# Patient Record
Sex: Female | Born: 2000 | Race: White | Hispanic: No | Marital: Single | Smoking: Never smoker
Health system: Southern US, Community
[De-identification: ages and names within clinical notes are randomized; demographics above are authoritative.]

## PROBLEM LIST (undated history)

## (undated) DIAGNOSIS — T7840XA Allergy, unspecified, initial encounter: Secondary | ICD-10-CM

## (undated) DIAGNOSIS — J45909 Unspecified asthma, uncomplicated: Secondary | ICD-10-CM

## (undated) DIAGNOSIS — K589 Irritable bowel syndrome without diarrhea: Secondary | ICD-10-CM

## (undated) DIAGNOSIS — K200 Eosinophilic esophagitis: Secondary | ICD-10-CM

## (undated) DIAGNOSIS — F909 Attention-deficit hyperactivity disorder, unspecified type: Secondary | ICD-10-CM

## (undated) DIAGNOSIS — K219 Gastro-esophageal reflux disease without esophagitis: Secondary | ICD-10-CM

## (undated) DIAGNOSIS — H539 Unspecified visual disturbance: Secondary | ICD-10-CM

## (undated) DIAGNOSIS — F419 Anxiety disorder, unspecified: Secondary | ICD-10-CM

## (undated) DIAGNOSIS — H6690 Otitis media, unspecified, unspecified ear: Secondary | ICD-10-CM

## (undated) DIAGNOSIS — K5900 Constipation, unspecified: Secondary | ICD-10-CM

## (undated) HISTORY — DX: Otitis media, unspecified, unspecified ear: H66.90

## (undated) HISTORY — PX: ESOPHAGOGASTRODUODENOSCOPY: SHX1529

## (undated) HISTORY — DX: Gastro-esophageal reflux disease without esophagitis: K21.9

## (undated) HISTORY — DX: Attention-deficit hyperactivity disorder, unspecified type: F90.9

## (undated) HISTORY — PX: TYMPANOPLASTY: SHX33

## (undated) HISTORY — DX: Unspecified visual disturbance: H53.9

## (undated) HISTORY — DX: Eosinophilic esophagitis: K20.0

## (undated) HISTORY — PX: TYMPANOSTOMY TUBE PLACEMENT: SHX32

## (undated) HISTORY — DX: Allergy, unspecified, initial encounter: T78.40XA

## (undated) HISTORY — DX: Unspecified asthma, uncomplicated: J45.909

## (undated) HISTORY — DX: Anxiety disorder, unspecified: F41.9

## (undated) HISTORY — DX: Constipation, unspecified: K59.00

## (undated) HISTORY — DX: Irritable bowel syndrome, unspecified: K58.9

---
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
## 2021-01-04 ENCOUNTER — Other Ambulatory Visit: Payer: Self-pay

## 2021-01-04 MED ORDER — DEXMETHYLPHENIDATE HCL ER 40 MG PO CP24
40.0000 mg | ORAL_CAPSULE | ORAL | 0 refills | Status: DC
Start: 2021-01-04 — End: 2021-02-05

## 2021-01-04 NOTE — Telephone Encounter (Signed)
RX for above e-scribed and sent to pharmacy on record  [id number] [place], [place] - [address] [facility] [address] [place] [address] Phone: [phone] Fax: [phone]

## 2021-01-04 NOTE — Telephone Encounter (Signed)
Last visit 10/19/2021 next visit 04/12/2021

## 2021-01-12 ENCOUNTER — Other Ambulatory Visit: Payer: Self-pay | Admitting: Family

## 2021-01-12 DIAGNOSIS — F411 Generalized anxiety disorder: Secondary | ICD-10-CM

## 2021-01-12 NOTE — Telephone Encounter (Signed)
Buspar 15 mg 3 tablets BID, # 540 for 3 month supply, 4 RF's.RX for above e-scribed and sent to pharmacy on record  [id number] - [place], [place] - [address] AT CORNER OF [facility] [address] [place] [address] Phone: [phone] Fax: [phone]

## 2021-01-18 DIAGNOSIS — F902 Attention-deficit hyperactivity disorder, combined type: Secondary | ICD-10-CM | POA: Diagnosis not present

## 2021-01-31 DIAGNOSIS — M545 Low back pain, unspecified: Secondary | ICD-10-CM | POA: Diagnosis not present

## 2021-02-01 DIAGNOSIS — F902 Attention-deficit hyperactivity disorder, combined type: Secondary | ICD-10-CM | POA: Diagnosis not present

## 2021-02-05 ENCOUNTER — Other Ambulatory Visit: Payer: Self-pay

## 2021-02-05 MED ORDER — DEXMETHYLPHENIDATE HCL 10 MG PO TABS: ORAL_TABLET | ORAL | 0 refills | Status: DC

## 2021-02-05 MED ORDER — DEXMETHYLPHENIDATE HCL ER 40 MG PO CP24: 40.0000 mg | ORAL_CAPSULE | ORAL | 0 refills | Status: DC

## 2021-02-05 NOTE — Telephone Encounter (Signed)
  RX for above e-scribed and sent to pharmacy on record  [facility] - [place], [place] - [address] [place] [address] Phone: [phone] Fax: [phone]

## 2021-02-05 NOTE — Telephone Encounter (Signed)
Last visit 10/19/2021 next visit 04/12/2021 

## 2021-02-08 DIAGNOSIS — F902 Attention-deficit hyperactivity disorder, combined type: Secondary | ICD-10-CM | POA: Diagnosis not present

## 2021-02-15 DIAGNOSIS — F902 Attention-deficit hyperactivity disorder, combined type: Secondary | ICD-10-CM | POA: Diagnosis not present

## 2021-02-20 DIAGNOSIS — F902 Attention-deficit hyperactivity disorder, combined type: Secondary | ICD-10-CM | POA: Diagnosis not present

## 2021-03-01 DIAGNOSIS — D485 Neoplasm of uncertain behavior of skin: Secondary | ICD-10-CM | POA: Diagnosis not present

## 2021-03-01 DIAGNOSIS — F902 Attention-deficit hyperactivity disorder, combined type: Secondary | ICD-10-CM | POA: Diagnosis not present

## 2021-03-01 DIAGNOSIS — D224 Melanocytic nevi of scalp and neck: Secondary | ICD-10-CM | POA: Diagnosis not present

## 2021-03-05 ENCOUNTER — Other Ambulatory Visit: Payer: Self-pay

## 2021-03-05 MED ORDER — DEXMETHYLPHENIDATE HCL ER 40 MG PO CP24: 40.0000 mg | ORAL_CAPSULE | ORAL | 0 refills | Status: DC

## 2021-03-05 NOTE — Telephone Encounter (Signed)
RX for above e-scribed and sent to pharmacy on record  [facility] - [place], [place] - [address] [place] [address] Phone: [phone] Fax: [phone]

## 2021-03-05 NOTE — Telephone Encounter (Signed)
Last visit 10/19/2021 next visit 04/12/2021

## 2021-03-15 DIAGNOSIS — F902 Attention-deficit hyperactivity disorder, combined type: Secondary | ICD-10-CM | POA: Diagnosis not present

## 2021-03-16 DIAGNOSIS — M545 Low back pain, unspecified: Secondary | ICD-10-CM | POA: Diagnosis not present

## 2021-03-26 DIAGNOSIS — M545 Low back pain, unspecified: Secondary | ICD-10-CM | POA: Diagnosis not present

## 2021-03-28 DIAGNOSIS — F902 Attention-deficit hyperactivity disorder, combined type: Secondary | ICD-10-CM | POA: Diagnosis not present

## 2021-03-29 DIAGNOSIS — M545 Low back pain, unspecified: Secondary | ICD-10-CM | POA: Diagnosis not present

## 2021-04-03 ENCOUNTER — Other Ambulatory Visit: Payer: Self-pay

## 2021-04-03 DIAGNOSIS — M545 Low back pain, unspecified: Secondary | ICD-10-CM | POA: Diagnosis not present

## 2021-04-03 MED ORDER — DEXMETHYLPHENIDATE HCL ER 40 MG PO CP24: 40.0000 mg | ORAL_CAPSULE | ORAL | 0 refills | Status: DC

## 2021-04-03 NOTE — Telephone Encounter (Signed)
Focalin XR 40 mg daily, # 30 with no RF's.RX for above e-scribed and sent to pharmacy [facility] record [facility] [place] [place]

## 2021-04-05 DIAGNOSIS — M545 Low back pain, unspecified: Secondary | ICD-10-CM | POA: Diagnosis not present

## 2021-04-06 MED ORDER — DEXMETHYLPHENIDATE HCL ER 40 MG PO CP24: 40.0000 mg | ORAL_CAPSULE | ORAL | 0 refills | Status: DC

## 2021-04-06 NOTE — Addendum Note (Signed)
Addended by: Carron Curie on: 04/06/2021 08:22 AM   Modules accepted: Orders

## 2021-04-06 NOTE — Telephone Encounter (Signed)
[facility]  in stock mom would like to sent to [facility] on [address]

## 2021-04-06 NOTE — Telephone Encounter (Signed)
Resent Rx to different pharmacy related to Focalin XR 40 mg on back order.RX for above e-scribed and sent to pharmacy on record   [id number] [place], [place] - [address] AT [facility] [address] [facility] [address] [facility] [address] [place] [address] Phone: [phone] Fax: [phone]

## 2021-04-06 NOTE — Addendum Note (Signed)
Addended by: Burgess Estelle on: 04/06/2021 08:15 AM   Modules accepted: Orders

## 2021-04-09 DIAGNOSIS — M545 Low back pain, unspecified: Secondary | ICD-10-CM | POA: Diagnosis not present

## 2021-04-12 ENCOUNTER — Encounter: Payer: Self-pay | Admitting: Family

## 2021-04-12 ENCOUNTER — Other Ambulatory Visit: Payer: Self-pay

## 2021-04-12 ENCOUNTER — Telehealth (INDEPENDENT_AMBULATORY_CARE_PROVIDER_SITE_OTHER): Payer: BC Managed Care – PPO | Admitting: Family

## 2021-04-12 DIAGNOSIS — R278 Other lack of coordination: Secondary | ICD-10-CM | POA: Diagnosis not present

## 2021-04-12 DIAGNOSIS — F411 Generalized anxiety disorder: Secondary | ICD-10-CM

## 2021-04-12 DIAGNOSIS — F902 Attention-deficit hyperactivity disorder, combined type: Secondary | ICD-10-CM | POA: Diagnosis not present

## 2021-04-12 DIAGNOSIS — Z7189 Other specified counseling: Secondary | ICD-10-CM

## 2021-04-12 DIAGNOSIS — F840 Autistic disorder: Secondary | ICD-10-CM

## 2021-04-12 DIAGNOSIS — Z719 Counseling, unspecified: Secondary | ICD-10-CM

## 2021-04-12 DIAGNOSIS — Z79899 Other long term (current) drug therapy: Secondary | ICD-10-CM

## 2021-04-12 NOTE — Progress Notes (Signed)
[facility] [address], [place] [place] [address] Dept: [phone] Dept Fax: [phone]  Medication Check visit via Virtual Video   Patient ID:  Tammy Maldonado  female DOB: 08-28-2001   20 y.o.   MRN: [id number]   DATE:04/12/21  PCP: Joaquin Courts, NP  Virtual Visit via Video Note  I connected with  Tammy Maldonado on 04/13/21 at  9:00 AM EDT by a video enabled telemedicine application and verified that I am speaking with the correct person using two identifiers. Patient/Parent Location: at home   I discussed the limitations, risks, security and privacy concerns of performing an evaluation and management service by telephone and the availability of in person appointments. I also discussed with the parents that there may be a patient responsible charge related to this service. The parents expressed understanding and agreed to proceed.  Provider: Carron Curie, NP  Location: work location  HPI/CURRENT STATUS: Tammy Maldonado is here for medication management of the psychoactive medications for ADHD and review of educational and behavioral concerns.   Tammy Maldonado currently taking Focalin XR 40 mg in the morning daily,  which is working well. Takes medication with breakfast. Medication tends to wear off around 2-3:00 pm and will take her immediate release Focalin 10 mg 1-2 tablets as needed. Tammy Maldonado is able to focus through school/homework.   Tammy Maldonado is eating well (eating breakfast, lunch and dinner). Eating well with no changes.   Sleeping well (getting about 8 hours most nights), sleeping through the night. Schedule requires more attention and attempting to get enough sleep each night.   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   Screen time: (phone, tablet, TV, computer): Minimal hours for the summer time.   MEDICAL HISTORY: Individual Medical History/ Review of Systems: Lower back muscle strain and now attending PT. Injury occurred in January with f/u at school to get assistance.   Family Medical/ Social History: Changes? None reported recently Patient Lives with: parents   MENTAL HEALTH: Mental Health Issues:   Anxiety-well controlled with Buspar   Allergies: Allergies  Allergen Reactions   Celexa [Citalopram] Rash    Current Medications:  Current Outpatient Medications  Medication Instructions   busPIRone (BUSPAR) 15 MG tablet TAKE 3 TABLETS BY MOUTH TWICE A DAY   cetirizine (ZYRTEC) 10 mg, Oral, Daily   dexmethylphenidate (FOCALIN) 10 MG tablet Take 1 tablet at noon and 2 tablets at 3-5 PM for homework   Dexmethylphenidate HCl (FOCALIN XR) 40 mg, Oral, BH-each morning   EPIDUO FORTE 0.3-2.5 % GEL Topical, Daily, as directed   fluticasone (FLONASE) 50 MCG/ACT nasal spray 1 spray, 2 times daily   minocycline (MINOCIN) 100 MG capsule TAKE 1 CAPSULE BY MOUTH ONCE A DAY WITH FOOD AND DO NOT TAKE ON AN EMPTY STOMACH   Medication Side Effects: None  DIAGNOSES:    ICD-10-CM   1. ADHD (attention deficit hyperactivity disorder), combined type  F90.2     2. Autism  F84.0     3. Dysgraphia  R27.8     4. Generalized anxiety disorder  F41.1     5. Medication management  Z79.899     6. Patient counseled  Z71.9     7. Goals of care, counseling/discussion  Z71.89      ASSESSMENT: Patient doing  well at college with above average grades last semester and has disability services in place. No academic issues reported by patient and adjusted to being away for college with no concerns. Has continued with her current medication regimen with no issues and reported efficacy for her ADHD and Anxiety symptoms. Currently attending PT for back injury related to muscle  strain during swim season and attending swim workouts routinely. No changes in eating habits. Needing to get more sleep on a regular basis. Tammy Maldonado has reported efficacy with her medication regimen with no current concerns. To continued with her current medication with no changes needed today. F/u in 3 months.   PLAN/RECOMMENDATIONS:  Patient provided updates with school success her 1st year in college at [employer] in [place]. Academic performance was above average with no issues or concerns.  Tammy Maldonado has continued with support services in the college setting with positive results. No difficulties with academics reported.   Currently getting PT for back injury due to muscle strain during swim season. Tammy Maldonado has continued with regular swim practice and work outs regularly.   Reviewed organization and time management with success this past year at school along with sports and social interactions.    Supported patient with appropriate sleep hygiene of 8 hours each night. Not getting enough sleep and some days feeling more tired. Discussed sleep hygiene at length with work and swim practice along with PT.   Counseled medication pharmacokinetics, options, dosage, administration, desired effects, and possible side effects.   Focalin XR 40 mg daily, no Rx today Focalin 10 mg 2-3 tablets in the pm prn, no Rx today Buspar 15 mg daily 3 tablets BID, no Rx today   I discussed the assessment and treatment plan with the patient. The patient was provided an opportunity to ask questions and all were answered. The patient agreed with the plan and demonstrated an understanding of the instructions.   I provided 25 minutes of non-face-to-face time during this encounter. Completed record review for 10 minutes prior to the virtual video visit.   NEXT APPOINTMENT:  August for in person visit before returning to college in [place].   Return in about 3 months (around 07/13/2021) for f/u visit.  The patient was advised to call back  or seek an in-person evaluation if the symptoms worsen or if the condition fails to improve as anticipated.   Carron Curie, NP

## 2021-04-13 ENCOUNTER — Encounter: Payer: Self-pay | Admitting: Family

## 2021-04-16 DIAGNOSIS — M545 Low back pain, unspecified: Secondary | ICD-10-CM | POA: Diagnosis not present

## 2021-04-19 DIAGNOSIS — M545 Low back pain, unspecified: Secondary | ICD-10-CM | POA: Diagnosis not present

## 2021-04-26 DIAGNOSIS — F902 Attention-deficit hyperactivity disorder, combined type: Secondary | ICD-10-CM | POA: Diagnosis not present

## 2021-05-07 ENCOUNTER — Telehealth: Payer: Self-pay | Admitting: Pediatrics

## 2021-05-07 DIAGNOSIS — M545 Low back pain, unspecified: Secondary | ICD-10-CM | POA: Diagnosis not present

## 2021-05-07 MED ORDER — DEXMETHYLPHENIDATE HCL ER 40 MG PO CP24: 40.0000 mg | ORAL_CAPSULE | ORAL | 0 refills | Status: DC

## 2021-05-07 NOTE — Telephone Encounter (Signed)
Mom called to request refill Focalin XR 40 E-Prescribed directly to  [id number] [place], [place] - [address] AT CORNER OF [facility] [address] [place] [address] Phone: [phone] Fax: [phone]

## 2021-05-10 DIAGNOSIS — M545 Low back pain, unspecified: Secondary | ICD-10-CM | POA: Diagnosis not present

## 2021-05-10 DIAGNOSIS — F902 Attention-deficit hyperactivity disorder, combined type: Secondary | ICD-10-CM | POA: Diagnosis not present

## 2021-05-22 DIAGNOSIS — M791 Myalgia, unspecified site: Secondary | ICD-10-CM | POA: Diagnosis not present

## 2021-05-24 DIAGNOSIS — M791 Myalgia, unspecified site: Secondary | ICD-10-CM | POA: Diagnosis not present

## 2021-05-24 DIAGNOSIS — F902 Attention-deficit hyperactivity disorder, combined type: Secondary | ICD-10-CM | POA: Diagnosis not present

## 2021-05-29 DIAGNOSIS — M791 Myalgia, unspecified site: Secondary | ICD-10-CM | POA: Diagnosis not present

## 2021-05-30 ENCOUNTER — Other Ambulatory Visit: Payer: Self-pay

## 2021-05-30 ENCOUNTER — Encounter: Payer: Self-pay | Admitting: Family

## 2021-05-30 ENCOUNTER — Ambulatory Visit: Payer: BC Managed Care – PPO | Admitting: Family

## 2021-05-30 VITALS — BP 112/72 | HR 76 | Resp 16 | Ht 69.29 in | Wt 175.4 lb

## 2021-05-30 DIAGNOSIS — F840 Autistic disorder: Secondary | ICD-10-CM

## 2021-05-30 DIAGNOSIS — F902 Attention-deficit hyperactivity disorder, combined type: Secondary | ICD-10-CM

## 2021-05-30 DIAGNOSIS — Z79899 Other long term (current) drug therapy: Secondary | ICD-10-CM

## 2021-05-30 DIAGNOSIS — F411 Generalized anxiety disorder: Secondary | ICD-10-CM | POA: Diagnosis not present

## 2021-05-30 DIAGNOSIS — R278 Other lack of coordination: Secondary | ICD-10-CM | POA: Diagnosis not present

## 2021-05-30 DIAGNOSIS — Z719 Counseling, unspecified: Secondary | ICD-10-CM

## 2021-05-30 DIAGNOSIS — Z7189 Other specified counseling: Secondary | ICD-10-CM

## 2021-05-30 MED ORDER — DEXMETHYLPHENIDATE HCL 10 MG PO TABS: ORAL_TABLET | ORAL | 0 refills | Status: DC

## 2021-05-30 MED ORDER — DEXMETHYLPHENIDATE HCL ER 40 MG PO CP24: 40.0000 mg | ORAL_CAPSULE | ORAL | 0 refills | Status: DC

## 2021-05-30 NOTE — Progress Notes (Signed)
[facility] [address] [place] [address] Dept: [phone] Dept Fax: [phone] Loc: [phone] Loc Fax: [phone]  Medication Check  Patient ID: Tammy Maldonado, female  DOB: [birth year], 20 y.o.  MRN: [id number]  Date of Evaluation: 05/30/2021 PCP: Joaquin Courts, NP  Accompanied by: self Patient Lives with: parents  HISTORY/CURRENT STATUS: HPI Patient here by herself for the visit today. Patient interactive and appropriate with provider. Patient did well academically last semester with disability services in place. Working out this summer and attending PT for her back. Has continued with medication regimen with no concerns or side effects.   EDUCATION: School: [employer] [place] Year/Grade:  Sophomore year   Performance/ Grades: above average Services: Other: Disability Services Activities/ Exercise: participates in swimming, working out still PT 2 times/week Campus living with 3 roommates  MEDICAL HISTORY: Appetite: Eating well with no changes  MVI/Other: Daily MVI    Sleep: No issues reported Concerns: Initiation/Maintenance/Other: None  Individual Medical History/ Review of Systems: Changes? :Yes PT related to back has continued 2 days/week. No recent PCP visit.   Allergies: Celexa [citalopram]  Current Medications: Medication Side Effects: None  Family Medical/ Social History: Changes? None   MENTAL HEALTH: Mental Health Issues: Anxiety-Buspar controlling symptoms  PHYSICAL EXAM; Vitals:  Vitals:   05/30/21 1307  BP: 112/72  Pulse: 76  Resp: 16  Weight: 175 lb 6.4 oz (79.6 kg)  Height: 5' 9.29" (1.76 m)    General Physical Exam: Unchanged from previous exam, date:04/12/2021 Changed:None  DIAGNOSES:    ICD-10-CM   1. ADHD (attention deficit hyperactivity disorder), combined type  F90.2     2. Autism  F84.0      3. Dysgraphia  R27.8     4. Generalized anxiety disorder  F41.1     5. Medication management  Z79.899     6. Patient counseled  Z71.9     7. Goals of care, counseling/discussion  Z71.89      ASSESSMENT: Tammy Maldonado is a 20 year old female with a history of ADHD, ASD and Anxiety with good symptom control on her current medication regimen. Academically performed above average for her spring semester at college with disability services in place. Participating in activity this summer with swimming and workouts along with PT for back injury. Eating a variety of foods daily with no concerns. Getting enough sleep each night. Will continue with current mediations with no changes in dosing or regimen. F/u in 3 months   RECOMMENDATIONS:  Patient provided updates with school and academic success last year. To return to [employer] in [place] next year.   Has continued with disability services at school and using for testing with extended time needed. Academic support has provided needed success this past school year.   Still getting PT on a regular basis for her back injury. Working out and swimming on a regular basis.  Eating with no current concerns and getting plenty of calories daily with activity. Supported protein with daily intake for foods and snacks.   Organization and time management discussed with school for academic success along with sports and social settings.   Sleep hygiene and bedtime routine discussed with plenty of sleep each night. Encouraged regular schedule with sleep/wake cycle at school.   Counseled medication pharmacokinetics, options, dosage, administration, desired effects, and possible side effects.   Focalin XR 40 mg daily,# 30 with no RF's Focalin 10 mg  2-3 tablets in the pm prn,# 90 with no RF"s Buspar 15 mg daily 3 tablets BID, no Rx today RX for above e-scribed and sent to pharmacy on record  [id number] [place], [place] - [address] AT [address] OF [facility]  [address] [place] [address] Phone: [phone] Fax: [phone]  I discussed the assessment and treatment plan with the patient. The patient was provided an opportunity to ask questions and all were answered. The patient agreed with the plan and demonstrated an understanding of the instructions.  NEXT APPOINTMENT: Return in about 3 months (around 08/30/2021) for f/u visit.  Carron Curie, NP Counseling Time: 27 mins Total Contact Time: 31 mins

## 2021-05-31 DIAGNOSIS — M791 Myalgia, unspecified site: Secondary | ICD-10-CM | POA: Diagnosis not present

## 2021-06-04 DIAGNOSIS — M791 Myalgia, unspecified site: Secondary | ICD-10-CM | POA: Diagnosis not present

## 2021-06-07 DIAGNOSIS — F902 Attention-deficit hyperactivity disorder, combined type: Secondary | ICD-10-CM | POA: Diagnosis not present

## 2021-06-07 DIAGNOSIS — M791 Myalgia, unspecified site: Secondary | ICD-10-CM | POA: Diagnosis not present

## 2021-06-11 DIAGNOSIS — M791 Myalgia, unspecified site: Secondary | ICD-10-CM | POA: Diagnosis not present

## 2021-06-12 DIAGNOSIS — F902 Attention-deficit hyperactivity disorder, combined type: Secondary | ICD-10-CM | POA: Diagnosis not present

## 2021-06-13 DIAGNOSIS — M791 Myalgia, unspecified site: Secondary | ICD-10-CM | POA: Diagnosis not present

## 2021-06-13 DIAGNOSIS — M545 Low back pain, unspecified: Secondary | ICD-10-CM | POA: Diagnosis not present

## 2021-06-13 DIAGNOSIS — F902 Attention-deficit hyperactivity disorder, combined type: Secondary | ICD-10-CM | POA: Diagnosis not present

## 2021-06-20 DIAGNOSIS — F902 Attention-deficit hyperactivity disorder, combined type: Secondary | ICD-10-CM | POA: Diagnosis not present

## 2021-06-28 DIAGNOSIS — F902 Attention-deficit hyperactivity disorder, combined type: Secondary | ICD-10-CM | POA: Diagnosis not present

## 2021-07-05 ENCOUNTER — Other Ambulatory Visit: Payer: Self-pay

## 2021-07-05 DIAGNOSIS — F902 Attention-deficit hyperactivity disorder, combined type: Secondary | ICD-10-CM | POA: Diagnosis not present

## 2021-07-05 MED ORDER — DEXMETHYLPHENIDATE HCL 10 MG PO TABS: ORAL_TABLET | ORAL | 0 refills | Status: DC

## 2021-07-05 MED ORDER — DEXMETHYLPHENIDATE HCL ER 40 MG PO CP24: 40.0000 mg | ORAL_CAPSULE | ORAL | 0 refills | Status: DC

## 2021-07-05 NOTE — Telephone Encounter (Signed)
Focalin XR 40 mg daily, # 30 with no RF's and Focalin 10 mg daily, # 30 with no RF's.RX for above e-scribed and sent to pharmacy on record  [facility] [phone] [place], [place] - [address] [place] [address] Phone: [phone] Fax: [phone]

## 2021-07-12 DIAGNOSIS — Z23 Encounter for immunization: Secondary | ICD-10-CM | POA: Diagnosis not present

## 2021-07-18 DIAGNOSIS — F902 Attention-deficit hyperactivity disorder, combined type: Secondary | ICD-10-CM | POA: Diagnosis not present

## 2021-07-31 DIAGNOSIS — F902 Attention-deficit hyperactivity disorder, combined type: Secondary | ICD-10-CM | POA: Diagnosis not present

## 2021-08-03 ENCOUNTER — Other Ambulatory Visit: Payer: Self-pay

## 2021-08-03 MED ORDER — DEXMETHYLPHENIDATE HCL 10 MG PO TABS: ORAL_TABLET | ORAL | 0 refills | Status: DC

## 2021-08-03 MED ORDER — DEXMETHYLPHENIDATE HCL ER 40 MG PO CP24: 40.0000 mg | ORAL_CAPSULE | ORAL | 0 refills | Status: DC

## 2021-08-03 NOTE — Telephone Encounter (Signed)
Focalin XR 40 mg daily, # 30 with no RF's, Focalin 10 mg 3 in the pm # 90 with no RF's.RX for above e-scribed and sent to pharmacy on record  [facility] [phone] [place], [place] - [address] [place] [address] Phone: [phone] Fax: [phone]

## 2021-08-08 DIAGNOSIS — F902 Attention-deficit hyperactivity disorder, combined type: Secondary | ICD-10-CM | POA: Diagnosis not present

## 2021-08-09 DIAGNOSIS — F902 Attention-deficit hyperactivity disorder, combined type: Secondary | ICD-10-CM | POA: Diagnosis not present

## 2021-08-16 DIAGNOSIS — F902 Attention-deficit hyperactivity disorder, combined type: Secondary | ICD-10-CM | POA: Diagnosis not present

## 2021-08-28 ENCOUNTER — Other Ambulatory Visit: Payer: Self-pay

## 2021-08-28 MED ORDER — DEXMETHYLPHENIDATE HCL 10 MG PO TABS: ORAL_TABLET | ORAL | 0 refills | Status: DC

## 2021-08-28 MED ORDER — DEXMETHYLPHENIDATE HCL ER 40 MG PO CP24: 40.0000 mg | ORAL_CAPSULE | ORAL | 0 refills | Status: DC

## 2021-08-28 NOTE — Telephone Encounter (Signed)
Focalin XR 40 mg daily, # 30 with no RFs and Focalin 10 mg 3 tablet in the afternoon, # 90 with no RF's.RX for above e-scribed and sent to pharmacy on record  [facility] [phone] [place], [place] - [address] [place] [address] Phone: [phone] Fax: [phone]

## 2021-08-30 DIAGNOSIS — F902 Attention-deficit hyperactivity disorder, combined type: Secondary | ICD-10-CM | POA: Diagnosis not present

## 2021-09-04 ENCOUNTER — Telehealth: Payer: Self-pay | Admitting: Family

## 2021-09-04 NOTE — Telephone Encounter (Signed)
  Emailed records for 05/30/21 to [email or web address]

## 2021-09-10 ENCOUNTER — Telehealth: Payer: BC Managed Care – PPO | Admitting: Family

## 2021-09-14 ENCOUNTER — Other Ambulatory Visit: Payer: Self-pay

## 2021-09-14 ENCOUNTER — Encounter: Payer: Self-pay | Admitting: Family

## 2021-09-14 ENCOUNTER — Telehealth (INDEPENDENT_AMBULATORY_CARE_PROVIDER_SITE_OTHER): Payer: BC Managed Care – PPO | Admitting: Family

## 2021-09-14 DIAGNOSIS — F411 Generalized anxiety disorder: Secondary | ICD-10-CM

## 2021-09-14 DIAGNOSIS — F840 Autistic disorder: Secondary | ICD-10-CM | POA: Diagnosis not present

## 2021-09-14 DIAGNOSIS — R278 Other lack of coordination: Secondary | ICD-10-CM

## 2021-09-14 DIAGNOSIS — Z719 Counseling, unspecified: Secondary | ICD-10-CM

## 2021-09-14 DIAGNOSIS — F902 Attention-deficit hyperactivity disorder, combined type: Secondary | ICD-10-CM | POA: Diagnosis not present

## 2021-09-14 DIAGNOSIS — Z79899 Other long term (current) drug therapy: Secondary | ICD-10-CM

## 2021-09-14 DIAGNOSIS — Z7189 Other specified counseling: Secondary | ICD-10-CM

## 2021-09-14 NOTE — Progress Notes (Signed)
[facility] [address], [place]. [address] [place] [address] Dept: [phone] Dept Fax: [phone]  Medication Check visit via Virtual Video   Patient ID:  Tammy Maldonado  female DOB: 07-Jul-2001   20 y.o.   MRN: [id number]   DATE:09/14/21  PCP: Joaquin Courts, NP  Virtual Visit via Video Note  I connected with  Tammy Maldonado on 09/14/21 at  7:30 AM EST by a video enabled telemedicine application and verified that I am speaking with the correct person using two identifiers. Patient/Parent Location: at home   I discussed the limitations, risks, security and privacy concerns of performing an evaluation and management service by telephone and the availability of in person appointments. I also discussed with the parents that there may be a patient responsible charge related to this service. The parents expressed understanding and agreed to proceed.  Provider: Carron Curie, NP  Location: private work locaiton  HPI/CURRENT STATUS: Tammy Maldonado is here for medication management of the psychoactive medications for ADHD and review of educational and behavioral concerns.   Tammy Maldonado currently taking medication regimen, which is working well. Takes medication as directed daily. Tammy Maldonado is able to focus through school/homework.   Tammy Maldonado is eating well (eating breakfast, lunch and dinner). Tammy Maldonado does not have appetite suppression  Sleeping well (getting about 5 hours/nigh), sleeping through the night. Tammy Maldonado does not have delayed sleep onset issues, but school work and swimming daily.   EDUCATION: School: [employer] in [place] Performance/ Grades: above average Services: Other: Disability Services  Activities/ Exercise: daily and participates in swimming at school, trainer working to assist with pain.  MEDICAL HISTORY: Individual Medical History/ Review of Systems: Yes, bronchitis in September with Z-Pak for treatment.   Has been healthy with no visits to the PCP. Yearly or as needed.   Family Medical/ Social History: Changes? No Patient Lives with:  Apartment on campus with 3 roommates  MENTAL HEALTH: Mental Health Issues:   Anxiety-Buspar for anxiety with good results.    Allergies: Allergies  Allergen Reactions   Celexa [Citalopram] Rash   Current Medications:  Current Outpatient Medications on File Prior to Visit  Medication Sig Dispense Refill   busPIRone (BUSPAR) 15 MG tablet TAKE 3 TABLETS BY MOUTH TWICE A DAY 540 tablet 4   cetirizine (ZYRTEC) 10 MG tablet Take 10 mg by mouth daily.     dexmethylphenidate (FOCALIN) 10 MG tablet Take 1 tablet at noon and 2 tablets at 3-5 PM for homework 90 tablet 0   Dexmethylphenidate HCl (FOCALIN XR) 40 MG CP24 Take 1 capsule (40 mg total) by mouth every morning. 30 capsule 0   EPIDUO FORTE 0.3-2.5 % GEL Apply topically daily. as directed     minocycline (MINOCIN) 100 MG capsule TAKE 1 CAPSULE BY MOUTH ONCE A DAY WITH FOOD AND DO NOT TAKE ON AN EMPTY STOMACH (Patient not taking: Reported on 09/14/2021)     No current facility-administered medications on file prior to visit.   Medication Side Effects: None  DIAGNOSES:    ICD-10-CM   1. ADHD (attention deficit hyperactivity disorder), combined type  F90.2     2. Autism  F84.0     3. Dysgraphia  R27.8     4. Generalized anxiety disorder  F41.1     5. Patient counseled  Z71.9     6. Medication management  Z79.899     7. Goals of care, counseling/discussion  Z71.89  ASSESSMENT:   Tammy Maldonado is a 20 year old female with a history of ADHD, Anxiety and ASD with good symptom control on her current medication regimen. She has done well with her classes this semester with no concerns with her performance. Has disability services on campus to use if needed. Has continued to swim for school with regular daily assistance from the athelic trainer related to back muscle pain. 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 NO other medical updates besides her back pain. To current with her medication regimen with no changes with dose or medications.   PLAN/RECOMMENDATIONS:  Updates for school, academics, progress, schedule for this semester and spring classes.  Tammy Maldonado has continued with disability services on campus for learning and attention support needs at college.   Eating well with no changes reported. Getting plenty of calories and protein each day for working out.   Getting plenty of exercise each day with swimming work outs for school daily. Trainer is helping with daily stretches   Organization and time management has continued with school success for college classes.   Sleep hygiene discussed and sleep routine discussed. Patient getting limited amounts of sleep each night with swimming and school work. Discussed importance of good sleep and 8 hours each night.   Counseled medication pharmacokinetics, options, dosage, administration, desired effects, and possible side effects.   Focalin XR 40 mg daily, no Rx today Focalin 10 mg 3 daily, no Rx today Buspar 15 mg 3 tablets BID, no Rx today   I discussed the assessment and treatment plan with the patient. The patient was provided an opportunity to ask questions and all were answered. The patient agreed with the plan and demonstrated an understanding of the instructions.   NEXT APPOINTMENT:  12/12/2021 Next f/u visit Telehealth OK  The patient was advised to call back or seek an in-person evaluation if the symptoms worsen or if the condition fails to improve as anticipated.   Carron Curie, NP

## 2021-09-19 ENCOUNTER — Telehealth: Payer: BC Managed Care – PPO | Admitting: Family

## 2021-09-27 DIAGNOSIS — F902 Attention-deficit hyperactivity disorder, combined type: Secondary | ICD-10-CM | POA: Diagnosis not present

## 2021-09-28 ENCOUNTER — Other Ambulatory Visit: Payer: Self-pay

## 2021-09-28 MED ORDER — DEXMETHYLPHENIDATE HCL 10 MG PO TABS: ORAL_TABLET | ORAL | 0 refills | Status: DC

## 2021-09-28 MED ORDER — DEXMETHYLPHENIDATE HCL ER 40 MG PO CP24: 40.0000 mg | ORAL_CAPSULE | ORAL | 0 refills | Status: DC

## 2021-09-28 NOTE — Telephone Encounter (Signed)
Focalin XR 40 mg daily, # 30 with no RF's and Focalin 10 mg 1 at noon and 1-2 in the evening, # 90 with no RF's.RX for above e-scribed and sent to pharmacy on record  [facility] [phone] [place], [place] - [address] [place] [address] Phone: [phone] Fax: [phone]

## 2021-10-08 MED ORDER — DEXMETHYLPHENIDATE HCL 10 MG PO TABS: ORAL_TABLET | ORAL | 0 refills | Status: DC

## 2021-10-08 NOTE — Telephone Encounter (Signed)
Focalin 10 mg 1 tablet at noon and 2 tablets for homework, #90 with no RF's.RX for above e-scribed and sent to pharmacy on record  [id number] - [place], [place] - [address] AT [address] OF [facility] [address] [place] [address] Phone: [phone] Fax: [phone]

## 2021-10-08 NOTE — Addendum Note (Signed)
Addended by: Carron Curie on: 10/08/2021 11:09 AM   Modules accepted: Orders

## 2021-10-08 NOTE — Addendum Note (Signed)
Addended by: Burgess Estelle on: 10/08/2021 08:22 AM   Modules accepted: Orders

## 2021-10-08 NOTE — Telephone Encounter (Signed)
Mom need Focalin 10mg  [facility] [address]

## 2021-10-09 DIAGNOSIS — L700 Acne vulgaris: Secondary | ICD-10-CM | POA: Diagnosis not present

## 2021-10-11 DIAGNOSIS — F902 Attention-deficit hyperactivity disorder, combined type: Secondary | ICD-10-CM | POA: Diagnosis not present

## 2021-10-25 DIAGNOSIS — F902 Attention-deficit hyperactivity disorder, combined type: Secondary | ICD-10-CM | POA: Diagnosis not present

## 2021-11-01 ENCOUNTER — Other Ambulatory Visit: Payer: Self-pay

## 2021-11-01 MED ORDER — DEXMETHYLPHENIDATE HCL ER 40 MG PO CP24: 40.0000 mg | ORAL_CAPSULE | ORAL | 0 refills | Status: DC

## 2021-11-01 MED ORDER — DEXMETHYLPHENIDATE HCL 10 MG PO TABS: ORAL_TABLET | ORAL | 0 refills | Status: DC

## 2021-11-01 NOTE — Telephone Encounter (Signed)
RX for above e-scribed and sent to pharmacy on record  [facility] [id number] [place], [place] - [address] [place] [address] Phone: [phone] Fax: [phone]

## 2021-11-08 DIAGNOSIS — F902 Attention-deficit hyperactivity disorder, combined type: Secondary | ICD-10-CM | POA: Diagnosis not present

## 2021-11-16 DIAGNOSIS — F902 Attention-deficit hyperactivity disorder, combined type: Secondary | ICD-10-CM | POA: Diagnosis not present

## 2021-11-30 ENCOUNTER — Other Ambulatory Visit: Payer: Self-pay

## 2021-11-30 MED ORDER — DEXMETHYLPHENIDATE HCL 10 MG PO TABS: ORAL_TABLET | ORAL | 0 refills | Status: DC

## 2021-11-30 MED ORDER — DEXMETHYLPHENIDATE HCL ER 40 MG PO CP24: 40.0000 mg | ORAL_CAPSULE | ORAL | 0 refills | Status: DC

## 2021-11-30 NOTE — Telephone Encounter (Signed)
E-Prescribed Focalin XR 40 and Focalin IR 10 directly to  [id number] [place], [place] - [address] [place] [address] Phone: [phone] Fax: [phone]

## 2021-12-07 DIAGNOSIS — F902 Attention-deficit hyperactivity disorder, combined type: Secondary | ICD-10-CM | POA: Diagnosis not present

## 2021-12-12 ENCOUNTER — Encounter: Payer: Self-pay | Admitting: Family

## 2021-12-12 ENCOUNTER — Telehealth (INDEPENDENT_AMBULATORY_CARE_PROVIDER_SITE_OTHER): Payer: BC Managed Care – PPO | Admitting: Family

## 2021-12-12 ENCOUNTER — Other Ambulatory Visit: Payer: Self-pay

## 2021-12-12 DIAGNOSIS — Z719 Counseling, unspecified: Secondary | ICD-10-CM

## 2021-12-12 DIAGNOSIS — F902 Attention-deficit hyperactivity disorder, combined type: Secondary | ICD-10-CM

## 2021-12-12 DIAGNOSIS — F411 Generalized anxiety disorder: Secondary | ICD-10-CM | POA: Diagnosis not present

## 2021-12-12 DIAGNOSIS — Z79899 Other long term (current) drug therapy: Secondary | ICD-10-CM

## 2021-12-12 DIAGNOSIS — Z7189 Other specified counseling: Secondary | ICD-10-CM

## 2021-12-12 DIAGNOSIS — F840 Autistic disorder: Secondary | ICD-10-CM

## 2021-12-12 DIAGNOSIS — R278 Other lack of coordination: Secondary | ICD-10-CM

## 2021-12-12 MED ORDER — DEXMETHYLPHENIDATE HCL ER 40 MG PO CP24: 40.0000 mg | ORAL_CAPSULE | ORAL | 0 refills | Status: DC

## 2021-12-12 MED ORDER — DEXMETHYLPHENIDATE HCL 10 MG PO TABS: ORAL_TABLET | ORAL | 0 refills | Status: DC

## 2021-12-12 NOTE — Progress Notes (Signed)
[facility]. [address]  [address] Dept: [phone] Dept Fax: [phone]  Medication Check visit via Virtual Video   Patient ID:  Tammy Maldonado  female DOB: 2001/09/09   21 y.o.   MRN: [id number]   DATE:12/12/21  PCP: Olevia Bowens, NP  Virtual Visit via Video Note  I connected with  Tammy Maldonado on 12/12/21 at  1:00 PM EST by a video enabled telemedicine application and verified that I am speaking with the correct person using two identifiers. Patient/Parent Location: at school in [place]   I discussed the limitations, risks, security and privacy concerns of performing an evaluation and management service by telephone and the availability of in person appointments. I also discussed with the parents that there may be a patient responsible charge related to this service. The parents expressed understanding and agreed to proceed.  Provider: Carolann Littler, NP  Location: work location  HPI/CURRENT STATUS: Tammy Maldonado is here for medication management of the psychoactive medications for ADHD and review of educational and behavioral concerns.   Tammy Maldonado currently taking the same medication regimen, which is working well. Takes medication in the morning for school. Medication tends to wear off around evening time and taking her Focalin 10 mg 2-3 tablets. Tammy Maldonado is able to focus through school & homework.   Tammy Maldonado is eating well (eating breakfast, lunch and dinner). Tammy Maldonado does not have appetite suppression but not consistent with eating when she gets busy. Anxiety directly affects her eating.   Sleeping well (goes to bed at 12:00 am wakes at 8-9:00 am now with swim season ending), sleeping through the night. Alethea does not have delayed sleep onset and sleeping through the night.   EDUCATION: School: [employer] Year/Grade:  college   Performance/ Grades: average Services: Other:  Disability Services  Activities/ Exercise: participates in swimming season just ended and will take a week off and resume with workouts.   MEDICAL HISTORY: Individual Medical History/ Review of Systems: Yes, ongoing care for her back issues daily  Has been healthy with no visits to the PCP. [place] due yearly for routine care.   Family Medical/ Social History: Changes? None Patient Lives with:  3 roommates  MENTAL HEALTH: Mental Health Issues:   Anxiety some ups and downs with school. Buspar 15 mg 3 tablets BID with good results   Allergies: Allergies  Allergen Reactions   Celexa [Citalopram] Rash   Current Medications:  Current Outpatient Medications on File Prior to Visit  Medication Sig Dispense Refill   busPIRone (BUSPAR) 15 MG tablet TAKE 3 TABLETS BY MOUTH TWICE A DAY 540 tablet 4   cetirizine (ZYRTEC) 10 MG tablet Take 10 mg by mouth daily.     EPIDUO FORTE 0.3-2.5 % GEL Apply topically daily. as directed     minocycline (MINOCIN) 100 MG capsule      Multiple Vitamin (MULTIVITAMIN WITH MINERALS) TABS tablet Take 1 tablet by mouth daily.     No current facility-administered medications on file prior to visit.   Medication Side Effects: None  DIAGNOSES:    ICD-10-CM   1. ADHD (attention deficit hyperactivity disorder), combined type  F90.2     2. Dysgraphia  R27.8     3. Generalized anxiety disorder  F41.1     4. Autism  F84.0     5. Medication management  Z79.899     6. Patient counseled  Z71.9     7. Goals  of care, counseling/discussion  Z71.89      ASSESSMENT:      Shawndee is a 21 year old female with a history of ADHD, Anxiety, ASD, and Dysgraphia. She has been well maintained on her medication regimen with good efficacy and no side effects. Academically doing well with a difficult semester. [name] has disability services in place on campus and tutoring is available. No health changes reported and will continue with daily treatment for her back. Sleeping has been  better with more hours due to off season. Eating patterns change based on working out or swim season along with anxiety level. Her anxiety has been better but fluctuation depending on school demands. No need for changes today but may consider increasing dose of Buspar during stressful times at school.   PLAN/RECOMMENDATIONS:  Updates with school, academics, progress, semester classes and difficulty this semester.  Patient with disability services on campus for learning and academic support along with her attention needs.  Continued with activity and swimming with working outs for post season.   Getting f/u visits for continued back issues with treatment on a daily basis.  Eating patterns discussed related to exercise and training with changes depending on the day.  Sleep had been consistent with no difficulties and getting more hours due to off season for swimming.   Anxiety with school and the effect on her eating has been relatable. Discussed for need to change doses during the day and increase if continues for a period of time.   Discussed vacation in March and contacting insurance for early refill of medication due to travel outside of the [place]  Counseled medication pharmacokinetics, options, dosage, administration, desired effects, and possible side effects.   Focalin XR 40 mg daily, # 30 for early fill for travel Focalin 10 mg 3 tablets in the pm # 90 for early fill for travel Buspar 15 mg 3 tablets BID, no Rx today RX for above e-scribed and sent to pharmacy on record  [facility] [phone] Tammy Maldonado, [place] [address] Phone: [phone] Fax: [phone]  I discussed the assessment and treatment plan with the patient. The patient was provided an opportunity to ask questions and all were answered. The patient agreed with the plan and demonstrated an understanding of the instructions.   NEXT APPOINTMENT:  03/19/2022-f/u visit  Telehealth  OK  The patient was advised to call back or seek an in-person evaluation if the symptoms worsen or if the condition fails to improve as anticipated.   Carolann Littler, NP

## 2021-12-13 ENCOUNTER — Encounter: Payer: Self-pay | Admitting: Family

## 2021-12-19 DIAGNOSIS — F902 Attention-deficit hyperactivity disorder, combined type: Secondary | ICD-10-CM | POA: Diagnosis not present

## 2021-12-31 ENCOUNTER — Other Ambulatory Visit: Payer: Self-pay | Admitting: Family

## 2021-12-31 DIAGNOSIS — F411 Generalized anxiety disorder: Secondary | ICD-10-CM

## 2021-12-31 MED ORDER — DEXMETHYLPHENIDATE HCL ER 40 MG PO CP24: 40.0000 mg | ORAL_CAPSULE | ORAL | 0 refills | Status: DC

## 2021-12-31 MED ORDER — BUSPIRONE HCL 15 MG PO TABS: 45.0000 mg | ORAL_TABLET | Freq: Two times a day (BID) | ORAL | 4 refills | Status: DC

## 2021-12-31 NOTE — Telephone Encounter (Signed)
Focalin XR 40 mg daily, # 30 with no RF's and Buspar 15 mg 3 times BID, # 540 with 4 RF's.RX for above e-scribed and sent to pharmacy on record ? ?[id number] [place], [place] - [address] ?[address] ?[place] [address] ?Phone: [phone] Fax: [phone] ? ? ? ? ?

## 2021-12-31 NOTE — Telephone Encounter (Signed)
Mom called for refill for Buspar 15mg , [facility] [id number], [place] [address] ?

## 2022-01-01 ENCOUNTER — Other Ambulatory Visit: Payer: Self-pay

## 2022-01-01 MED ORDER — DEXMETHYLPHENIDATE HCL 10 MG PO TABS: ORAL_TABLET | ORAL | 0 refills | Status: DC

## 2022-01-01 NOTE — Telephone Encounter (Signed)
Focalin 10 mg up to 3 tablets in the afternoon, # 90 with no RF's.RX for above e-scribed and sent to pharmacy on record ? ?[id number] [place], [place] - [address] ?[address] ?[place] [address] ?Phone: [phone] Fax: [phone] ? ? ? ? ?

## 2022-01-04 DIAGNOSIS — F902 Attention-deficit hyperactivity disorder, combined type: Secondary | ICD-10-CM | POA: Diagnosis not present

## 2022-01-09 DIAGNOSIS — F902 Attention-deficit hyperactivity disorder, combined type: Secondary | ICD-10-CM | POA: Diagnosis not present

## 2022-01-16 DIAGNOSIS — F902 Attention-deficit hyperactivity disorder, combined type: Secondary | ICD-10-CM | POA: Diagnosis not present

## 2022-01-18 ENCOUNTER — Other Ambulatory Visit: Payer: Self-pay

## 2022-01-21 MED ORDER — DEXMETHYLPHENIDATE HCL 10 MG PO TABS: ORAL_TABLET | ORAL | 0 refills | Status: DC

## 2022-01-21 MED ORDER — DEXMETHYLPHENIDATE HCL ER 40 MG PO CP24: 40.0000 mg | ORAL_CAPSULE | ORAL | 0 refills | Status: DC

## 2022-01-21 NOTE — Telephone Encounter (Signed)
Focalin XR 40 mg daily, # 30 with no RF's and Focalin 10 mg 3 in the afternoon, # 90 with no RF's.RX for above e-scribed and sent to pharmacy on record ? ?[id number] [place], [place] - [address] ?[address] ?[place] [address] ?Phone: [phone] Fax: [phone] ? ? ? ? ?

## 2022-01-29 DIAGNOSIS — F902 Attention-deficit hyperactivity disorder, combined type: Secondary | ICD-10-CM | POA: Diagnosis not present

## 2022-02-04 ENCOUNTER — Other Ambulatory Visit: Payer: Self-pay

## 2022-02-05 DIAGNOSIS — F902 Attention-deficit hyperactivity disorder, combined type: Secondary | ICD-10-CM | POA: Diagnosis not present

## 2022-02-26 ENCOUNTER — Other Ambulatory Visit: Payer: Self-pay

## 2022-02-26 MED ORDER — DEXMETHYLPHENIDATE HCL ER 40 MG PO CP24: 40.0000 mg | ORAL_CAPSULE | ORAL | 0 refills | Status: DC

## 2022-02-26 NOTE — Telephone Encounter (Signed)
Focalin XR 40 mg daily, # 30 with no RF's.RX for above e-scribed and sent to pharmacy on record ? ?[facility] [address] ?[place] ?[place] [address] ?Phone: [phone] Fax: [phone] ? ? ? ?

## 2022-02-27 ENCOUNTER — Other Ambulatory Visit: Payer: Self-pay

## 2022-02-27 MED ORDER — DEXMETHYLPHENIDATE HCL ER 40 MG PO CP24: 40.0000 mg | ORAL_CAPSULE | ORAL | 0 refills | Status: DC

## 2022-02-27 NOTE — Telephone Encounter (Signed)
Focalin XR 40 mg daily, # 30 with no RF's sent to different pharmacy.RX for above e-scribed and sent to pharmacy on record ? ?[facility] [place], [address] ?[place] ?[place] [address] ?Phone: [phone] Fax: [phone] ? ? ? ? ?

## 2022-02-27 NOTE — Telephone Encounter (Signed)
Meds need to go to [facility] [address]  ?

## 2022-03-05 DIAGNOSIS — M545 Low back pain, unspecified: Secondary | ICD-10-CM | POA: Insufficient documentation

## 2022-03-07 ENCOUNTER — Encounter: Payer: Self-pay | Admitting: Family

## 2022-03-07 ENCOUNTER — Ambulatory Visit: Payer: BC Managed Care – PPO | Admitting: Family

## 2022-03-07 VITALS — BP 118/68 | HR 72 | Resp 16 | Ht 67.0 in | Wt 169.4 lb

## 2022-03-07 DIAGNOSIS — F411 Generalized anxiety disorder: Secondary | ICD-10-CM | POA: Diagnosis not present

## 2022-03-07 DIAGNOSIS — Z719 Counseling, unspecified: Secondary | ICD-10-CM

## 2022-03-07 DIAGNOSIS — F840 Autistic disorder: Secondary | ICD-10-CM | POA: Diagnosis not present

## 2022-03-07 DIAGNOSIS — Z79899 Other long term (current) drug therapy: Secondary | ICD-10-CM

## 2022-03-07 DIAGNOSIS — F902 Attention-deficit hyperactivity disorder, combined type: Secondary | ICD-10-CM | POA: Diagnosis not present

## 2022-03-07 DIAGNOSIS — R278 Other lack of coordination: Secondary | ICD-10-CM | POA: Diagnosis not present

## 2022-03-07 DIAGNOSIS — Z7189 Other specified counseling: Secondary | ICD-10-CM

## 2022-03-07 MED ORDER — SERTRALINE HCL 25 MG PO TABS: 25.0000 mg | ORAL_TABLET | Freq: Every day | ORAL | 2 refills | Status: DC

## 2022-03-07 NOTE — Patient Instructions (Addendum)
Starting tomorrow decrease the Buspar to 2 tablets twice daily for the next 5 days, then 1 tablets twice daily for 5 days, then 1 tablet daily for 5 days before stopping. At the time you start with 1 tablet daily of the Buspar you can start your Zoloft 25 mg daily for 1-2 weeks, increase to 2 tablets daily if needed of the Zoloft.

## 2022-03-07 NOTE — Progress Notes (Signed)
[facility] [address] [place] [address] Dept: [phone] Dept Fax: [phone] Loc: [phone] Loc Fax: [phone]  Medication Check  Patient ID: Berdine [place], female  DOB: [birth year], 21 y.o.  MRN: [id number]  Date of Evaluation: 03/07/2022 PCP: Joaquin Courts, NP  Accompanied by:  self Patient Lives with: parents  HISTORY/CURRENT STATUS: HPI Patient here by herself for the visit, but call father during the visit to obtain history of previous medications along with reactions. [place]enn[place] was appropriate and interactive with provider at the visit. Having more ongoing anxiety that has been more consistent even with her current Buspar 45 mg BID. No concerns with her Focalin XR or Focalin in the afternoon.   EDUCATION: School: [employer]  Year/Grade:  college   Performance/ Grades: outstanding Services: Other: disability services Activities/ Exercise: participates in swimming  MEDICAL HISTORY: Appetite: Good  MVI/Other: Daily  Some variety of foods  Sleep: No current issues with sleep at home   Concerns: Initiation/Maintenance/Other: None  Individual Medical History/ Review of Systems: Changes? :Yes, Anxiety has worsened and more consistent. Seeing ortho for low back pain.   Allergies: Celexa [citalopram]  Current Medications:  Medication Side Effects: None  Family Medical/ Social History: Changes? No  MENTAL HEALTH: Mental Health Issues: Anxiety  PHYSICAL EXAM; Vitals: There were no vitals taken for this visit.  General Physical Exam: Unchanged from previous exam, date:12/12/2021 Changed:None  DIAGNOSES:    ICD-10-CM   1. ADHD (attention deficit hyperactivity disorder), combined type  F90.2     2. Autism  F84.0     3. Generalized anxiety disorder  F41.1     4. Dysgraphia  R27.8     5. Patient counseled  Z71.9     6.  Medication management  Z79.899     7. Goals of care, counseling/discussion  Z71.89      ASSESSMENT: Shirlena is a 21 year old female with a history of ADHD, Anxiety, ASD, and Dysgraphia. She has been well maintained on Focalin XR 40 mg in the morning and Focalin 10 mg up to 3 times in the afternoon for her ADHD with good efficacy. More anxiety even with her Buspar 15 mg 3 tablets BID. Patient wanting options for more effective treatment for her anxiety. Patient was treated prior to [facility] with medication rash from the medication. Academically doing well with no concerns and still has disability services in place. Will be working this summer in [place]. No changes with eating, sleeping or changes in health. Focalin to continue, but will address medication change for her anxiety.   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  Accommodations for learning with disability services in place at college with  no changes.   More anxiety recently that been increasing, but not controlled by her current Buspar BID of 45 mg.  Discussion of medication history along with with reaction of previous SSRI used to treat her anxiety at an early age.  Patient called father for history of use of Citalopram and reaction. Only to tablet and not liquid form of the medication with a systemic rash.   Options for treatment discussed at length along with review of physical chart notes from 2015 at her initial intake visit with parents.   Due to history and warning from Rx for Zoloft discussed previous history. Will consult with pharmacist for safety of medication.  Verified use of Zoloft with  pharmacist and discussed very small percent that a reaction would occur.  Reviewed is reaction occurs to stop medication, take Benadryl and call 911 if unable to breathe.   Also referred to pharmacogenetic testing results regarding medication for treatment.  Counseled medication  pharmacokinetics, options, dosage, administration, desired effects, and possible side effects.   Focalin XR 40 mg daily, no Rx today Focalin 10 mg 3 tablets in the pm, no Rx today Titration off Buspar with instructions Zoloft 25 mg daily, # 30 with 2 RF's RX for above e-scribed and sent to pharmacy on record  [id number] - [place], [place] - [address] AT [address] [facility] [address] [place] [address] Phone: [phone] Fax: [phone]  Instructions- Starting tomorrow decrease the Buspar to 2 tablets twice daily for the next 5 days, then 1 tablets twice daily for 5 days, then 1 tablet daily for 5 days before stopping. At the time you start with 1 tablet daily of the Buspar you can start your Zoloft 25 mg daily for 1 week, increase to 2 tablets daily if needed of the Zoloft.    NEXT APPOINTMENT: Return in about 3 months (around 06/07/2022) for f/u visit .  The patient was advised to call back or seek an in-person evaluation if the symptoms worsen or if the condition fails to improve as anticipated.  Carron Curie, NP

## 2022-03-08 ENCOUNTER — Encounter: Payer: Self-pay | Admitting: Family

## 2022-03-19 ENCOUNTER — Encounter: Payer: BC Managed Care – PPO | Admitting: Family

## 2022-03-29 ENCOUNTER — Other Ambulatory Visit: Payer: Self-pay | Admitting: Family

## 2022-03-29 NOTE — Telephone Encounter (Signed)
RX for above e-scribed and sent to pharmacy on record Needed 90 day prescription  [id number] - [place], [place] - [address] AT [address] OF [facility] [address] [place] [address] Phone: [phone] Fax: [phone]

## 2022-04-04 ENCOUNTER — Other Ambulatory Visit: Payer: Self-pay

## 2022-04-04 ENCOUNTER — Other Ambulatory Visit (HOSPITAL_BASED_OUTPATIENT_CLINIC_OR_DEPARTMENT_OTHER): Payer: Self-pay

## 2022-04-04 ENCOUNTER — Other Ambulatory Visit (HOSPITAL_COMMUNITY): Payer: Self-pay

## 2022-04-04 ENCOUNTER — Encounter: Payer: BC Managed Care – PPO | Admitting: Family

## 2022-04-04 DIAGNOSIS — M5126 Other intervertebral disc displacement, lumbar region: Secondary | ICD-10-CM | POA: Insufficient documentation

## 2022-04-04 MED ORDER — DEXMETHYLPHENIDATE HCL 10 MG PO TABS
ORAL_TABLET | ORAL | 0 refills | Status: DC
  Filled 2022-04-04: qty 90, 30d supply, fill #0

## 2022-04-04 MED ORDER — DEXMETHYLPHENIDATE HCL ER 40 MG PO CP24
40.0000 mg | ORAL_CAPSULE | ORAL | 0 refills | Status: DC
  Filled 2022-04-04: qty 30, 30d supply, fill #0

## 2022-04-04 NOTE — Telephone Encounter (Signed)
Focalin XR 40 mg daily, # 30 with no RF's and Focalin 10 mg 3 in the afternoon, # 90 with no RF's.RX for above e-scribed and sent to pharmacy on record    [facility] [address] [place] [address] Phone: [phone] Fax: [phone]

## 2022-04-05 ENCOUNTER — Other Ambulatory Visit (HOSPITAL_COMMUNITY): Payer: Self-pay

## 2022-04-05 ENCOUNTER — Other Ambulatory Visit: Payer: Self-pay

## 2022-04-05 MED ORDER — DEXMETHYLPHENIDATE HCL ER 40 MG PO CP24
40.0000 mg | ORAL_CAPSULE | ORAL | 0 refills | Status: DC
  Filled 2022-04-05: qty 30, 30d supply, fill #0

## 2022-04-05 NOTE — Telephone Encounter (Signed)
Send Rx for Focalin XR 40 mg daily to different pharmacy # 30 with no RF's.RX for above e-scribed and sent to pharmacy on record  [facility] [address] [place] [address] Phone: [phone] Fax: [phone]

## 2022-04-05 NOTE — Telephone Encounter (Addendum)
Tammy Maldonado does not have Focalin XR in stock and would like it sent to [facility]. Patient also emailed in that she is taking 50mg  of Zoloft

## 2022-04-08 ENCOUNTER — Other Ambulatory Visit (HOSPITAL_COMMUNITY): Payer: Self-pay

## 2022-04-12 ENCOUNTER — Telehealth (INDEPENDENT_AMBULATORY_CARE_PROVIDER_SITE_OTHER): Payer: 59 | Admitting: Family

## 2022-04-12 ENCOUNTER — Encounter: Payer: Self-pay | Admitting: Family

## 2022-04-12 DIAGNOSIS — F902 Attention-deficit hyperactivity disorder, combined type: Secondary | ICD-10-CM | POA: Diagnosis not present

## 2022-04-12 DIAGNOSIS — F411 Generalized anxiety disorder: Secondary | ICD-10-CM | POA: Diagnosis not present

## 2022-04-12 DIAGNOSIS — Z7189 Other specified counseling: Secondary | ICD-10-CM

## 2022-04-12 DIAGNOSIS — R278 Other lack of coordination: Secondary | ICD-10-CM

## 2022-04-12 DIAGNOSIS — Z719 Counseling, unspecified: Secondary | ICD-10-CM

## 2022-04-12 DIAGNOSIS — F840 Autistic disorder: Secondary | ICD-10-CM | POA: Diagnosis not present

## 2022-04-12 DIAGNOSIS — Z79899 Other long term (current) drug therapy: Secondary | ICD-10-CM

## 2022-04-14 ENCOUNTER — Encounter: Payer: Self-pay | Admitting: Family

## 2022-04-16 ENCOUNTER — Telehealth: Payer: Self-pay | Admitting: Family

## 2022-04-16 ENCOUNTER — Other Ambulatory Visit (HOSPITAL_BASED_OUTPATIENT_CLINIC_OR_DEPARTMENT_OTHER): Payer: Self-pay

## 2022-04-16 MED ORDER — SERTRALINE HCL 50 MG PO TABS
75.0000 mg | ORAL_TABLET | Freq: Every day | ORAL | 2 refills | Status: DC
  Filled 2022-04-16: qty 60, 30d supply, fill #0

## 2022-04-17 ENCOUNTER — Other Ambulatory Visit (HOSPITAL_COMMUNITY): Payer: Self-pay

## 2022-04-18 ENCOUNTER — Other Ambulatory Visit (HOSPITAL_BASED_OUTPATIENT_CLINIC_OR_DEPARTMENT_OTHER): Payer: Self-pay

## 2022-05-03 ENCOUNTER — Other Ambulatory Visit: Payer: Self-pay

## 2022-05-05 MED ORDER — DEXMETHYLPHENIDATE HCL 10 MG PO TABS
ORAL_TABLET | ORAL | 0 refills | Status: DC
  Filled 2022-05-05: qty 90, 45d supply, fill #0

## 2022-05-05 MED ORDER — DEXMETHYLPHENIDATE HCL ER 40 MG PO CP24
40.0000 mg | ORAL_CAPSULE | ORAL | 0 refills | Status: DC
  Filled 2022-05-05: qty 30, 30d supply, fill #0

## 2022-05-05 NOTE — Telephone Encounter (Signed)
Focalin XR 40 mg daily, # 30 with no RF's and Focalin 10 g 3 tablets in the afternoon, # 90 with no RF's.RX for above e-scribed and sent to pharmacy on record  [facility] at [facility] [address] [place] [address] Phone: [phone] Fax: [phone]

## 2022-05-06 ENCOUNTER — Other Ambulatory Visit (HOSPITAL_BASED_OUTPATIENT_CLINIC_OR_DEPARTMENT_OTHER): Payer: Self-pay

## 2022-05-22 ENCOUNTER — Telehealth: Payer: Self-pay | Admitting: Family

## 2022-05-22 ENCOUNTER — Other Ambulatory Visit (HOSPITAL_BASED_OUTPATIENT_CLINIC_OR_DEPARTMENT_OTHER): Payer: Self-pay

## 2022-05-22 MED ORDER — SERTRALINE HCL 50 MG PO TABS
75.0000 mg | ORAL_TABLET | Freq: Every day | ORAL | 0 refills | Status: DC
  Filled 2022-05-22: qty 180, 90d supply, fill #0

## 2022-05-22 NOTE — Telephone Encounter (Signed)
Mom called in for refill for Zoloft to be sent to [facility].

## 2022-05-22 NOTE — Telephone Encounter (Signed)
Zoloft 50 mg 1.5-2 tablets daily, # 180 with no RF's.RX for above e-scribed and sent to pharmacy on record  [facility] at [facility] [address] [place] [address] Phone: [phone] Fax: [phone]

## 2022-05-23 ENCOUNTER — Other Ambulatory Visit (HOSPITAL_BASED_OUTPATIENT_CLINIC_OR_DEPARTMENT_OTHER): Payer: Self-pay

## 2022-06-03 ENCOUNTER — Encounter: Payer: Self-pay | Admitting: Family

## 2022-06-03 ENCOUNTER — Telehealth (INDEPENDENT_AMBULATORY_CARE_PROVIDER_SITE_OTHER): Payer: 59 | Admitting: Family

## 2022-06-03 ENCOUNTER — Other Ambulatory Visit (HOSPITAL_BASED_OUTPATIENT_CLINIC_OR_DEPARTMENT_OTHER): Payer: Self-pay

## 2022-06-03 DIAGNOSIS — Z7189 Other specified counseling: Secondary | ICD-10-CM

## 2022-06-03 DIAGNOSIS — F840 Autistic disorder: Secondary | ICD-10-CM

## 2022-06-03 DIAGNOSIS — R278 Other lack of coordination: Secondary | ICD-10-CM

## 2022-06-03 DIAGNOSIS — F411 Generalized anxiety disorder: Secondary | ICD-10-CM | POA: Diagnosis not present

## 2022-06-03 DIAGNOSIS — Z719 Counseling, unspecified: Secondary | ICD-10-CM

## 2022-06-03 DIAGNOSIS — F902 Attention-deficit hyperactivity disorder, combined type: Secondary | ICD-10-CM | POA: Diagnosis not present

## 2022-06-03 DIAGNOSIS — Z79899 Other long term (current) drug therapy: Secondary | ICD-10-CM

## 2022-06-03 MED ORDER — DEXMETHYLPHENIDATE HCL 10 MG PO TABS
ORAL_TABLET | ORAL | 0 refills | Status: DC
  Filled 2022-06-03: qty 90, 30d supply, fill #0

## 2022-06-03 MED ORDER — DEXMETHYLPHENIDATE HCL ER 40 MG PO CP24
40.0000 mg | ORAL_CAPSULE | ORAL | 0 refills | Status: DC
  Filled 2022-06-03: qty 30, 30d supply, fill #0

## 2022-06-03 NOTE — Progress Notes (Signed)
[facility] [address] [place] [place] [address] Dept: [phone] Dept Fax: [phone]  Medication Check visit via Virtual Video   Patient ID:  Tammy Maldonado  female DOB: 20-Jan-2001   21 y.o.   MRN: [id number]   DATE:06/03/22  PCP: Joaquin Courts, NP  Virtual Visit via Video Note  I connected with  Tammy Maldonado on 06/03/22 at  1:00 PM EDT by a video enabled telemedicine application and verified that I am speaking with the correct person using two identifiers. Patient/Parent Location: at home   I discussed the limitations, risks, security and privacy concerns of performing an evaluation and management service by telephone and the availability of in person appointments. I also discussed with the parents that there may be a patient responsible charge related to this service. The parents expressed understanding and agreed to proceed.  Provider: Carron Curie, NP  Location: private work location  HPI/CURRENT STATUS: Tammy Maldonado is here for medication management of the psychoactive medications for ADHD and review of educational and behavioral concerns.   Tammy Maldonado currently taking Focalin XR,   which is working well. Takes medication as directed daily, Medication tends to last for the time needed. Tammy Maldonado is able to focus through school & homework.   Tammy Maldonado is eating well (eating breakfast, lunch and dinner). Tammy Maldonado has some appetite suppression related to anxiety and limited with not working out.   Sleeping well (getting enough sleep most nights), sleeping through the night. Tammy Maldonado does not have delayed sleep onset.  EDUCATION: School: [employer]  Year/Grade:  college   Performance/ Grades:  did well last year Services: Other: disability services on campus and to be renewed this year.   Activities/ Exercise: daily working out and swimming. Practice for school starts in September. Had some  practice time this summer with the club team 2-3 times weekly.   MEDICAL HISTORY: Individual Medical History/ Review of Systems: Yes, urgent care for finger injury.  Has been healthy with no visits to the PCP. WCC due yearly. PT continued related to back pain.   Family Medical/ Social History: None Patient Lives with: parents at home, to move into a house for school with 3 other roommates.  MENTAL HEALTH: Mental Health Issues: Anxiety with therapy for biweekly that started [facility] June. Now on 125 mg Zoloft right now.   Allergies: Allergies  Allergen Reactions   Citalopram Rash   Current Medications:  Current Outpatient Medications  Medication Instructions   cetirizine (ZYRTEC) 10 mg, Daily   dexmethylphenidate (FOCALIN) 10 MG tablet Take 1 tablet at noon and 2 tablets at 3-5 PM for homework   Dexmethylphenidate HCl (FOCALIN XR) 40 mg, Oral, BH-each morning   EPIDUO FORTE 0.3-2.5 % GEL Topical, Daily, as directed   levocetirizine (XYZAL) 5 MG tablet Xyzal   minocycline (MINOCIN) 100 MG capsule    Multiple Vitamin (MULTIVITAMIN WITH MINERALS) TABS tablet 1 tablet, Oral, Daily   sertraline (ZOLOFT) 75-100 mg, Oral, Daily   Medication Side Effects: None  DIAGNOSES:    ICD-10-CM   1. ADHD (attention deficit hyperactivity disorder), combined type  F90.2     2. Autism  F84.0     3. Dysgraphia  R27.8     4. Generalized anxiety disorder  F41.1     5. Medication management  Z79.899     6. Patient counseled  Z71.9     7. Goals of care, counseling/discussion  Z71.89  ASSESSMENT:     Tammy Maldonado is a 21 year old female with a history of ADHD, ASD and Anxiety. She has been maintained on Focalin XR 40 mg in the morning, Focalin 10 mg up to 3 times daily, and now Zoloft at 125 mg daily with good efficacy. Started online counseling bi-weekly, but may move to weekly with anxiety around school starting. Working in [employer] this summer. Academically taking 16 credit hours for the fall  semester with disability services to be renewed for this year. Will return to school on Thursday with classes starting on the 28th of August. Moving to a new house off campus with 3 roommates that are the same from last year. No changes with eating habits, but when not working out eats less. Sleeping with no reported concerns. Going to PT for ongoing low back pain. Lihanna has continued with working out and swimming this summer. Medication regimen to remain the same with adjustment as needed with her Zoloft for school.   PLAN/RECOMMENDATIONS:  Updates for school this year with taking 16 credit hours for the fall semester.  Taking mostly 4 credit classes with mostly classes for her major.  Disability services in place and will renew this year for continued support.   Activity and exercising has continued this summer to say in shape for swim team at school.   PT and chiropractic care with ongoing back pain has assist with her lower back pain recovery process.  Eating well with no concerns and getting in plenty of protein with activity level.  Sleeping with no changes and schedule to change with school starting in the next 2 weeks.   Medication management for her current medications and current dosing with adjustments as needed.  Counseled medication pharmacokinetics, options, dosage, administration, desired effects, and possible side effects.   Focalin XR 40 mg daily, # 30 with no RF's Focalin 10 mg 3 in the afternoon, # 90 with no RF's Zoloft 50 mg 1.5-2 daily with no Rx today RX for above e-scribed and sent to pharmacy on record  [facility] at [facility] [address] [place] [address] Phone: [phone] Fax: [phone]  I discussed the assessment and treatment plan with the patient. The patient was provided an opportunity to ask questions and all were answered. The patient agreed with the plan and demonstrated an understanding of the  instructions.   NEXT APPOINTMENT:  08/26/2022-f/u visit Telehealth OK  The patient was advised to call back or seek an in-person evaluation if the symptoms worsen or if the condition fails to improve as anticipated.  Carron Curie, NP

## 2022-06-04 ENCOUNTER — Other Ambulatory Visit (HOSPITAL_BASED_OUTPATIENT_CLINIC_OR_DEPARTMENT_OTHER): Payer: Self-pay

## 2022-07-01 ENCOUNTER — Other Ambulatory Visit (HOSPITAL_BASED_OUTPATIENT_CLINIC_OR_DEPARTMENT_OTHER): Payer: Self-pay

## 2022-07-01 ENCOUNTER — Other Ambulatory Visit: Payer: Self-pay | Admitting: Family

## 2022-07-01 MED ORDER — DEXMETHYLPHENIDATE HCL ER 40 MG PO CP24
40.0000 mg | ORAL_CAPSULE | ORAL | 0 refills | Status: DC
  Filled 2022-07-01: qty 30, 30d supply, fill #0

## 2022-07-01 NOTE — Telephone Encounter (Signed)
RX for above e-scribed and sent to pharmacy on record ? ? [facility] ?[address] ?[place] [address] ?Phone: [phone] Fax: [phone] ? ? ?

## 2022-07-21 ENCOUNTER — Other Ambulatory Visit: Payer: Self-pay | Admitting: Family

## 2022-07-22 ENCOUNTER — Other Ambulatory Visit (HOSPITAL_BASED_OUTPATIENT_CLINIC_OR_DEPARTMENT_OTHER): Payer: Self-pay

## 2022-07-22 MED ORDER — SERTRALINE HCL 100 MG PO TABS
150.0000 mg | ORAL_TABLET | Freq: Every day | ORAL | 1 refills | Status: DC
  Filled 2022-07-22: qty 30, 20d supply, fill #0
  Filled 2022-07-22: qty 105, 70d supply, fill #0
  Filled 2022-10-15: qty 135, 90d supply, fill #1

## 2022-07-22 MED ORDER — DEXMETHYLPHENIDATE HCL 10 MG PO TABS
ORAL_TABLET | ORAL | 0 refills | Status: DC
  Filled 2022-07-22: qty 90, 30d supply, fill #0

## 2022-07-22 NOTE — Telephone Encounter (Signed)
Focalin 10 mg 3 tablets in the afternoon, #90 with no RF's and Zoloft 100 mg 1.5 tablets daily, #135 with 1 RF's.Tammy Maldonado Sessions for above e-scribed and sent to pharmacy on record  [place] [address] [place] [address] Phone: [phone] Fax: [phone]

## 2022-08-01 ENCOUNTER — Telehealth: Payer: Self-pay | Admitting: Family

## 2022-08-01 ENCOUNTER — Other Ambulatory Visit (HOSPITAL_BASED_OUTPATIENT_CLINIC_OR_DEPARTMENT_OTHER): Payer: Self-pay

## 2022-08-01 MED ORDER — DEXMETHYLPHENIDATE HCL ER 40 MG PO CP24
40.0000 mg | ORAL_CAPSULE | ORAL | 0 refills | Status: DC
  Filled 2022-08-01: qty 30, 30d supply, fill #0

## 2022-08-01 NOTE — Telephone Encounter (Signed)
E-Prescribed Focalin XR 40 directly to  [place] [address] Phone: [phone] Fax: [phone]

## 2022-08-01 NOTE — Telephone Encounter (Signed)
Mom called in for refill for Focalin Xr 40MG  [facility] [place]

## 2022-08-26 ENCOUNTER — Encounter: Payer: Self-pay | Admitting: Family

## 2022-08-26 ENCOUNTER — Telehealth (INDEPENDENT_AMBULATORY_CARE_PROVIDER_SITE_OTHER): Payer: 59 | Admitting: Family

## 2022-08-26 ENCOUNTER — Other Ambulatory Visit (HOSPITAL_BASED_OUTPATIENT_CLINIC_OR_DEPARTMENT_OTHER): Payer: Self-pay

## 2022-08-26 DIAGNOSIS — F411 Generalized anxiety disorder: Secondary | ICD-10-CM | POA: Diagnosis not present

## 2022-08-26 DIAGNOSIS — Z7189 Other specified counseling: Secondary | ICD-10-CM

## 2022-08-26 DIAGNOSIS — F902 Attention-deficit hyperactivity disorder, combined type: Secondary | ICD-10-CM

## 2022-08-26 DIAGNOSIS — R278 Other lack of coordination: Secondary | ICD-10-CM | POA: Diagnosis not present

## 2022-08-26 DIAGNOSIS — F840 Autistic disorder: Secondary | ICD-10-CM | POA: Diagnosis not present

## 2022-08-26 DIAGNOSIS — Z719 Counseling, unspecified: Secondary | ICD-10-CM

## 2022-08-26 DIAGNOSIS — Z79899 Other long term (current) drug therapy: Secondary | ICD-10-CM

## 2022-08-26 MED ORDER — DEXMETHYLPHENIDATE HCL ER 40 MG PO CP24
40.0000 mg | ORAL_CAPSULE | ORAL | 0 refills | Status: DC
  Filled 2022-08-26 – 2022-08-30 (×2): qty 30, 30d supply, fill #0

## 2022-08-26 MED ORDER — DEXMETHYLPHENIDATE HCL 10 MG PO TABS
ORAL_TABLET | ORAL | 0 refills | Status: DC
  Filled 2022-08-26: qty 90, 30d supply, fill #0

## 2022-08-26 NOTE — Progress Notes (Signed)
[facility]. [address] [place] [address] Dept: [phone] Dept Fax: [phone]  Medication Check visit via Virtual Video   Patient ID:  Tammy Maldonado  female DOB: Oct 09, 2001   21 y.o.   MRN: [id number]   DATE:08/26/22  PCP: Olevia Bowens, NP  Virtual Visit via Video Note  I connected with  [place] on 08/26/22 at  3:00 PM EST by a video enabled telemedicine application and verified that I am speaking with the correct person using two identifiers. Patient/Parent Location: at school.  I discussed the limitations, risks, security and privacy concerns of performing an evaluation and management service by telephone and the availability of in person appointments. I also discussed with the parents that there may be a patient responsible charge related to this service. The parents expressed understanding and agreed to proceed.  Provider: Carolann Littler, NP  Location: private work location  HPI/CURRENT STATUS: Tammy Maldonado is here for medication management of the psychoactive medications for ADHD and review of educational and behavioral concerns.   Tammy Maldonado currently taking Focalin XR 40 mg daily, Zoloft 100 mg 1.5 tablets daily and Focalin 10 mg daily  up to 3 in the afternoon or evening time, which is working well. Takes medication as directed daily.  Tammy Maldonado is able to focus through school & homework.   Tammy Maldonado is eating well (eating breakfast, lunch and dinner). Tammy Maldonado does not have appetite suppression and getting plenty of calories daily.   Sleeping well (goes to bed at 2400-0100 pm wakes at [place], 0545, 0615 or 0800-1000 depending on schedule for practice), sleeping through the night. Tammy Maldonado does not have delayed sleep onset  EDUCATION: School: [employer] in [place]   Performance/ Grades: above average Services: Other: Disability Services  Activities/ Exercise: daily with PT,  swimming and/or weight lifting.   MEDICAL HISTORY: Individual Medical History/ Review of Systems: This past week sprained her ankle and has a boot applied for immobilization. Back is still in recovery and attending PT daily to every other day for continued help.  Has been healthy with no visits to the PCP. [place] due yearly.   Family Medical/ Social History: Disability services.  Brittanny Lives with:  3 roommates in a house.   MENTAL HEALTH: Mental Health Issues:   Anxiety with counseling weekly with good progress. Seeing [facility]  Allergies: Allergies  Allergen Reactions   Citalopram Rash   Current Medications:  Current Outpatient Medications  Medication Instructions   cetirizine (ZYRTEC) 10 mg, Daily   dexmethylphenidate (FOCALIN) 10 MG tablet Take 1 tablet by mouth at noon and 2 tablets at 3-5 PM for homework   Dexmethylphenidate HCl (FOCALIN XR) 40 mg, Oral, BH-each morning   EPIDUO FORTE 0.3-2.5 % GEL Topical, Daily, as directed   levocetirizine (XYZAL) 5 MG tablet Xyzal   minocycline (MINOCIN) 100 MG capsule    Multiple Vitamin (MULTIVITAMIN WITH MINERALS) TABS tablet 1 tablet, Oral, Daily   sertraline (ZOLOFT) 150 mg, Oral, Daily   Medication Side Effects: None  DIAGNOSES:    ICD-10-CM   1. ADHD (attention deficit hyperactivity disorder), combined type  F90.2     2. Autism  F84.0     3. Generalized anxiety disorder  F41.1     4. Dysgraphia  R27.8     5. Medication management  Z79.899     6. Patient counseled  Z71.9     7. Goals of care, counseling/discussion  Z71.89      ASSESSMENT:  Tammy Maldonado is a 21 year old female attending [employer] in [place], [place]. She has been well maintained on Focalin XR 40 mg daily, Focalin 10 mg up to 3 tablets in the afternoon and Zoloft 150 mg daily with good efficacy. Academically doing well with taking 17 credits this semester and still has disability services in place. Has continued with swimming, PT, and weight lifting. Eating  well with good calories and protein intake. Recent sprained ankle with boot applied for 1-2 weeks. No other changes reported with her back and continued with PT every other day. Sleeping well with no change reported. Anxiety has been well controlled and changed counselors recently with good progress reported. No medication changes needed today.   PLAN/RECOMMENDATIONS:  School progress and academics reviewed for the fall semester.  Discussed her taking 17 credit hours this semester and managing her schedule with no issues.  Has continued with her Disability Services for learning and support system.   Anxiety has been well managed with medication and weekly counseling session with new counselor.   Exercising daily with swim practice, weight lifting and PT depending on the day.  Eating plenty of calories daily with sustained exercise and getting the protein to support the level of activity.   Sleeping on a routine schedule depending on practice, weight lifting, PT and classes.   Medication management discussed with patient with reported symptom control.   Counseled medication pharmacokinetics, options, dosage, administration, desired effects, and possible side effects.   Focalin XR 40 mg daily #30 with no RF's Focalin 10 mg 1-3 tablets daily in the afternoon, #90 with no RF's Zoloft 100 mg 1.5 mg daily, no Rx today RX for above e-scribed and sent to pharmacy on record  [facility] [address] [place] [address] Phone: [phone] Fax: [phone]  I discussed the assessment and treatment plan with [facility]. Corlene was provided an opportunity to ask questions and all were answered. Zabdi agreed with the plan and demonstrated an understanding of the instructions.  REVIEW OF CHART, FACE TO FACE CLINIC TIME AND DOCUMENTATION TIME DURING TODAY'S VISIT:  30 mins      NEXT APPOINTMENT:  11/29/2022-f/u visit  Telehealth OK  The patient  was advised to call back or seek an in-person evaluation if the symptoms worsen or if the condition fails to improve as anticipated.   Carron Curie, NP

## 2022-08-30 ENCOUNTER — Other Ambulatory Visit (HOSPITAL_BASED_OUTPATIENT_CLINIC_OR_DEPARTMENT_OTHER): Payer: Self-pay

## 2022-09-30 ENCOUNTER — Other Ambulatory Visit: Payer: Self-pay | Admitting: Family

## 2022-09-30 ENCOUNTER — Other Ambulatory Visit (HOSPITAL_BASED_OUTPATIENT_CLINIC_OR_DEPARTMENT_OTHER): Payer: Self-pay

## 2022-09-30 MED ORDER — DEXMETHYLPHENIDATE HCL 10 MG PO TABS
ORAL_TABLET | ORAL | 0 refills | Status: DC
  Filled 2022-09-30: qty 50, 17d supply, fill #0
  Filled 2022-09-30: qty 40, 13d supply, fill #0

## 2022-09-30 MED ORDER — DEXMETHYLPHENIDATE HCL ER 40 MG PO CP24
40.0000 mg | ORAL_CAPSULE | ORAL | 0 refills | Status: DC
  Filled 2022-09-30: qty 30, 30d supply, fill #0

## 2022-09-30 NOTE — Telephone Encounter (Signed)
E-Prescribed Focalin XR 40 and Focalin IR 10 directly to  [facility] [address] [place] [address] Phone: [phone] Fax: [phone]

## 2022-10-29 ENCOUNTER — Other Ambulatory Visit: Payer: Self-pay

## 2022-10-29 ENCOUNTER — Other Ambulatory Visit (HOSPITAL_BASED_OUTPATIENT_CLINIC_OR_DEPARTMENT_OTHER): Payer: Self-pay

## 2022-10-29 MED ORDER — DEXMETHYLPHENIDATE HCL ER 40 MG PO CP24
40.0000 mg | ORAL_CAPSULE | ORAL | 0 refills | Status: DC
  Filled 2022-10-29: qty 30, 30d supply, fill #0

## 2022-10-29 NOTE — Telephone Encounter (Signed)
Focalin XR 40 mg daily, #30 with no RF's.RX for above e-scribed and sent to pharmacy on record  [facility] [address] [place] [address] Phone: [phone] Fax: [phone]   

## 2022-11-28 ENCOUNTER — Other Ambulatory Visit: Payer: Self-pay

## 2022-11-28 ENCOUNTER — Other Ambulatory Visit (HOSPITAL_BASED_OUTPATIENT_CLINIC_OR_DEPARTMENT_OTHER): Payer: Self-pay

## 2022-11-28 ENCOUNTER — Other Ambulatory Visit: Payer: Self-pay | Admitting: Family

## 2022-11-28 MED ORDER — DEXMETHYLPHENIDATE HCL ER 40 MG PO CP24
40.0000 mg | ORAL_CAPSULE | ORAL | 0 refills | Status: DC
  Filled 2022-11-28: qty 30, 30d supply, fill #0

## 2022-11-28 NOTE — Telephone Encounter (Signed)
Focalin XR 40 mg daily, #30 with no RF's.RX for above e-scribed and sent to pharmacy on record  [facility] [address] [place] [address] Phone: [phone] Fax: [phone]

## 2022-11-29 ENCOUNTER — Encounter: Payer: Self-pay | Admitting: Family

## 2022-11-29 ENCOUNTER — Other Ambulatory Visit (HOSPITAL_BASED_OUTPATIENT_CLINIC_OR_DEPARTMENT_OTHER): Payer: Self-pay

## 2022-11-29 ENCOUNTER — Telehealth (INDEPENDENT_AMBULATORY_CARE_PROVIDER_SITE_OTHER): Payer: Commercial Managed Care - PPO | Admitting: Family

## 2022-11-29 DIAGNOSIS — F840 Autistic disorder: Secondary | ICD-10-CM | POA: Diagnosis not present

## 2022-11-29 DIAGNOSIS — F411 Generalized anxiety disorder: Secondary | ICD-10-CM | POA: Diagnosis not present

## 2022-11-29 DIAGNOSIS — Z79899 Other long term (current) drug therapy: Secondary | ICD-10-CM

## 2022-11-29 DIAGNOSIS — R278 Other lack of coordination: Secondary | ICD-10-CM | POA: Diagnosis not present

## 2022-11-29 DIAGNOSIS — Z7189 Other specified counseling: Secondary | ICD-10-CM

## 2022-11-29 DIAGNOSIS — F902 Attention-deficit hyperactivity disorder, combined type: Secondary | ICD-10-CM | POA: Diagnosis not present

## 2022-11-29 DIAGNOSIS — Z719 Counseling, unspecified: Secondary | ICD-10-CM

## 2022-11-29 MED ORDER — DEXMETHYLPHENIDATE HCL 10 MG PO TABS
ORAL_TABLET | ORAL | 0 refills | Status: DC
  Filled 2022-12-27 – 2022-12-28 (×2): qty 90, 30d supply, fill #0

## 2022-11-29 MED ORDER — DEXMETHYLPHENIDATE HCL 10 MG PO TABS
ORAL_TABLET | ORAL | 0 refills | Status: DC
  Filled 2023-01-30: qty 90, 30d supply, fill #0

## 2022-11-29 MED ORDER — DEXMETHYLPHENIDATE HCL ER 40 MG PO CP24
40.0000 mg | ORAL_CAPSULE | Freq: Every day | ORAL | 0 refills | Status: DC
  Filled 2022-12-27 – 2022-12-28 (×2): qty 30, 30d supply, fill #0

## 2022-11-29 MED ORDER — DEXMETHYLPHENIDATE HCL 10 MG PO TABS
ORAL_TABLET | ORAL | 0 refills | Status: DC
  Filled 2022-11-29: qty 90, 30d supply, fill #0

## 2022-11-29 MED ORDER — SERTRALINE HCL 100 MG PO TABS
150.0000 mg | ORAL_TABLET | Freq: Every day | ORAL | 1 refills | Status: DC
  Filled 2022-11-29 – 2022-12-30 (×3): qty 135, 90d supply, fill #0
  Filled 2023-03-31: qty 135, 90d supply, fill #1

## 2022-11-29 MED ORDER — DEXMETHYLPHENIDATE HCL ER 40 MG PO CP24
40.0000 mg | ORAL_CAPSULE | Freq: Every day | ORAL | 0 refills | Status: DC
  Filled 2023-01-30: qty 30, 30d supply, fill #0

## 2022-11-29 NOTE — Progress Notes (Signed)
[facility]. [address] [place] [address] Dept: [phone] Dept Fax: [phone]  Medication Check visit via Virtual Video   Patient ID:  Tammy Maldonado  female DOB: Feb 12, 2001   22 y.o.   MRN: [id number]   DATE:11/29/22  PCP: Olevia Bowens, NP  Virtual Visit via Video Note I connected with  Tammy Maldonado on 11/29/22 at  8:00 AM EST by a video enabled telemedicine application and verified that I am speaking with the correct person using two identifiers. Patient/Parent Location: at school  I discussed the limitations, risks, security and privacy concerns of performing an evaluation and management service by telephone and the availability of in person appointments. I also discussed with the parents that there may be a patient responsible charge related to this service. The parents expressed understanding and agreed to proceed.  Provider: Carolann Littler, NP  Location: private work location  HPI/CURRENT STATUS: Tammy Maldonado is here for medication management of the psychoactive medications for ADHD and review of educational and behavioral concerns.   Tammy Maldonado currently taking Focalin XR 40 mg daily and Zoloft 150 mg, which is working well. Takes medication at breakfast time. Medication tends to wear off around afternoon and takes her Focalin 10 mg 3 tablets for homework. Tammy Maldonado is able to focus through school and homework.   Tammy Maldonado is eating well (eating breakfast, lunch and dinner). Tammy Maldonado does not have appetite suppression and getting in enough calories.  Sleeping well (getting plenty of sleep each night), sleeping through the night. Tammy Maldonado does not have delayed sleep onset and getting plenty of sleep now.   EDUCATION: School: Tam[employer] Maldon[employer], Tammy Maldonado Year/Grade:  college   Performance/ Grades: above average Services: Art therapist Exercise:  swim season is over, workout start next week, PT  every other day.  MEDICAL HISTORY: Individual Medical History/ Review of Systems: Sprained ankle in the fall from stepping "oddly" crutches and boot for a few weeks, then brace.  Has been healthy with no visits to the PCP. Tammy Maldonado due yearly.   Family Medical/ Social History: None Tammy Maldonado Lives with:  roommates at school  MENTAL HEALTH: Mental Health Issues: Anxiety    Allergies: Allergies  Allergen Reactions   Citalopram Rash    Current Medications:  Current Outpatient Medications on File Prior to Visit  Medication Sig Dispense Refill   Dexmethylphenidate HCl (FOCALIN XR) 40 MG CP24 Take 1 capsule (40 mg total) by mouth every morning. 30 capsule 0   cetirizine (ZYRTEC) 10 MG tablet Take 10 mg by mouth daily. (Patient not taking: Reported on 06/03/2022)     EPIDUO FORTE 0.3-2.5 % GEL Apply topically daily. as directed     levocetirizine (XYZAL) 5 MG tablet Xyzal     minocycline (MINOCIN) 100 MG capsule  (Patient not taking: Reported on 06/03/2022)     Multiple Vitamin (MULTIVITAMIN WITH MINERALS) TABS tablet Take 1 tablet by mouth daily.     No current facility-administered medications on file prior to visit.  Medication Side Effects: None  DIAGNOSES:    ICD-10-CM   1. ADHD (attention deficit hyperactivity disorder), combined type  F90.2     2. Autism  F84.0     3. Generalized anxiety disorder  F41.1     4. Dysgraphia  R27.8     5. Medication management  Z79.899     6. Patient counseled  Z71.9     7. Goals of care, counseling/discussion  Z71.89  ASSESSMENT:    Tammy Maldonado is a 22 year old female with a history of ADHD, ASD and Anxiety. She has been maintained on Focalin XR 40 mg daily, Focalin 10 mg up to 3 in the afternoon, and Zoloft 100 mg 150 mg daily with good efficacy. Academically having some challenges with more difficult classes and taking 19 credit hours. Disability services in place with tutoring available. Just finished her swim season and will start workouts next  week. Attending PT every other day for continued support with back pain. NO other injuries reported since last injury with her ankle. Eating well with no changes reported. Sleep habits have not changed. Will continue her medication regimen with no changes reported. Will provided enough medication for the next 3 months.   PLAN/RECOMMENDATIONS:  Updates with school and academic progress this semester.  Discussed level of difficulty and credit hours.  Disability services in place and tutoring available on campus.  Health updates with continued PT for back pain discussed.  Recent issues with peers and social interactions due to negativity.  Workout to begin next week and swim season discussed.  Not currently with a counselor and discussed options for new therapist with change in insurance.   Discussed sleep habits and current rest week with no swim or training.  Medication management discussed with patient for efficacy.   Counseled medication pharmacokinetics, options, dosage, administration, desired effects, and possible side effects.   Zoloft 100 mg 1.5 tablets daily, #135 with no RF's Focalin XR 40 mg daily, #30 with no RF's post dated for 12/27/22 & 01/27/23 Focalin 10 mg 3 daily in the afternoon, #90 with no RF's.Post dated 2 more Rx's for 12/27/22 & 01/27/23. RX for above e-scribed and sent to pharmacy on record  [facility] [place] [address] Phone: [phone] Fax: [phone]  I discussed the assessment and treatment plan with Tammy Maldonado. [name] was provided an opportunity to ask questions and all were answered. [name] agreed with the plan and demonstrated an understanding of the instructions.  REVIEW OF CHART, FACE TO FACE CLINIC TIME AND DOCUMENTATION TIME DURING TODAY'S VISIT:   40 mins   NEXT APPOINTMENT: Return to PCP  The patient was advised to call back or seek an in-person evaluation if the symptoms worsen or  if the condition fails to improve as anticipated.   Tammy Littler, NP

## 2022-12-26 ENCOUNTER — Encounter: Payer: Self-pay | Admitting: Family Medicine

## 2022-12-26 ENCOUNTER — Ambulatory Visit: Payer: Commercial Managed Care - PPO | Admitting: Family Medicine

## 2022-12-26 ENCOUNTER — Other Ambulatory Visit (HOSPITAL_BASED_OUTPATIENT_CLINIC_OR_DEPARTMENT_OTHER): Payer: Self-pay

## 2022-12-26 ENCOUNTER — Other Ambulatory Visit: Payer: Self-pay | Admitting: Family

## 2022-12-26 VITALS — BP 118/78 | HR 94 | Temp 98.9°F | Ht 70.0 in | Wt 176.9 lb

## 2022-12-26 DIAGNOSIS — R011 Cardiac murmur, unspecified: Secondary | ICD-10-CM | POA: Insufficient documentation

## 2022-12-26 DIAGNOSIS — L700 Acne vulgaris: Secondary | ICD-10-CM | POA: Diagnosis not present

## 2022-12-26 DIAGNOSIS — F902 Attention-deficit hyperactivity disorder, combined type: Secondary | ICD-10-CM

## 2022-12-26 NOTE — Assessment & Plan Note (Signed)
Has a provider for this, reports good control of her symptoms with her medication.

## 2022-12-26 NOTE — Progress Notes (Signed)
New Patient Office Visit  Subjective    Patient ID: Tammy Maldonado, female    DOB: Dec 25, 2000  Age: 22 y.o. MRN: [id number]  CC:  Chief Complaint  Patient presents with   Establish Care    HPI Tammy Maldonado presents to establish care Was previously a patient at [facility]. Sees Tammy Maldonado Mention for ADHD and anxiety. Patient does not have any new symptoms or issues to discuss today. Just need to establish. Pt reports a history of autism, history of childhood asthma, and acid reflux. She is taking minocyline for her acne. Reports she sees a dermatologist for this.   States she also had a history of heart murmur as a child, but thinks this has resolved. She also states her asthma has also resolved since childhood.   I reviewed all aspects of the patient's past medical history, surgical, social and family history. Had a maternal grandmother with history of breast cancer.  Outpatient Encounter Medications as of 12/26/2022  Medication Sig   dexmethylphenidate (FOCALIN) 10 MG tablet Take 1 tablet by mouth at noon and 2 tablets at 3-5 PM for homework   [START ON 12/27/2022] dexmethylphenidate (FOCALIN) 10 MG tablet Take one tablet by mouth at noon time and 2 tablets between 3-5 pm daily for homework.   [START ON 01/27/2023] dexmethylphenidate (FOCALIN) 10 MG tablet Take one tablet by mouth at noon time and 2 tablets between 3-5 pm daily for homework.   Dexmethylphenidate HCl (FOCALIN XR) 40 MG CP24 Take 1 capsule (40 mg total) by mouth every morning.   [START ON 12/27/2022] Dexmethylphenidate HCl (FOCALIN XR) 40 MG CP24 Take 1 capsule (40 mg total) by mouth daily.   [START ON 01/27/2023] Dexmethylphenidate HCl (FOCALIN XR) 40 MG CP24 Take 1 capsule (40 mg total) by mouth daily.   EPIDUO FORTE 0.3-2.5 % GEL Apply topically daily. as directed   levocetirizine (XYZAL) 5 MG tablet Xyzal   minocycline (MINOCIN) 100 MG capsule    Multiple Vitamin (MULTIVITAMIN WITH MINERALS) TABS tablet Take 1 tablet by  mouth daily.   sertraline (ZOLOFT) 100 MG tablet Take 1.5 tablets (150 mg total) by mouth daily.   [DISCONTINUED] cetirizine (ZYRTEC) 10 MG tablet Take 10 mg by mouth daily. (Patient not taking: Reported on 06/03/2022)   No facility-administered encounter medications on file as of 12/26/2022.    Past Medical History:  Diagnosis Date   ADHD    ADHD (attention deficit hyperactivity disorder)    Allergy    Anxiety    Asthma    Constipation    Eosinophilic esophagitis    GERD (gastroesophageal reflux disease)    IBS (irritable bowel syndrome)    Otitis media    infant   Vision abnormalities    glasses, near sighted    Past Surgical History:  Procedure Laterality Date   ESOPHAGOGASTRODUODENOSCOPY     2012   TYMPANOPLASTY     TYMPANOSTOMY TUBE PLACEMENT     13 months    Family History  Problem Relation Age of Onset   Hypertension Mother    Anxiety disorder Mother    Obesity Mother    Miscarriages / [place] Mother    Stroke Father    ADD / ADHD Brother    Anxiety disorder Brother    Cancer Maternal Uncle        skin   ADD / ADHD Paternal Aunt    Cancer Maternal Grandmother        died at 63 with breast ca  Hypertension Maternal Grandfather    Hyperlipidemia Maternal Grandfather    Hearing loss Maternal Grandfather    Cancer Maternal Grandfather        skin   Hypertension Paternal Grandfather    Heart disease Paternal Grandfather    Cancer Paternal Grandfather     Social History   Socioeconomic History   Marital status: Single    Spouse name: Not on file   Number of children: Not on file   Years of education: Not on file   Highest education level: Not on file  Occupational History   Not on file  Tobacco Use   Smoking status: Never   Smokeless tobacco: Never  Vaping Use   Vaping Use: Never used  Substance and Sexual Activity   Alcohol use: No    Alcohol/week: 0.0 standard drinks of alcohol   Drug use: No   Sexual activity: Never  Other Topics  Concern   Not on file  Social History Narrative   Not on file   Social Determinants of Health   Financial Resource Strain: Not on file  Food Insecurity: Not on file  Transportation Needs: Not on file  Physical Activity: Not on file  Stress: Not on file  Social Connections: Not on file  Intimate Partner Violence: Not on file    Review of Systems  All other systems reviewed and are negative.       Objective    BP 118/78 (BP Location: Right Arm, Patient Position: Sitting, Cuff Size: Normal)   Pulse 94   Temp 98.9 F (37.2 C) (Oral)   Ht '5\' 10"'$  (1.778 m)   Wt 176 lb 14.4 oz (80.2 kg)   LMP 12/24/2022 (Exact Date)   SpO2 100%   BMI 25.38 kg/m   Physical Exam Vitals reviewed.  Constitutional:      Appearance: Normal appearance. She is well-groomed and normal weight.  Eyes:     Conjunctiva/sclera: Conjunctivae normal.  Neck:     Thyroid: No thyromegaly.  Cardiovascular:     Rate and Rhythm: Normal rate and regular rhythm.     Pulses: Normal pulses.     Heart sounds: S1 normal and S2 normal.  Pulmonary:     Effort: Pulmonary effort is normal.     Breath sounds: Normal breath sounds and air entry.  Abdominal:     General: Bowel sounds are normal.  Musculoskeletal:     Right lower leg: No edema.     Left lower leg: No edema.  Neurological:     Mental Status: She is alert and oriented to person, place, and time. Mental status is at baseline.     Gait: Gait is intact.  Psychiatric:        Mood and Affect: Mood and affect normal.        Speech: Speech normal.        Behavior: Behavior normal.        Judgment: Judgment normal.         Assessment & Plan:   Problem List Items Addressed This Visit       Unprioritized   ADHD (attention deficit hyperactivity disorder), combined type (Chronic)    Has a provider for this, reports good control of her symptoms with her medication.       Heart murmur - Primary    Not heard on exam today, will continue to  monitor. Will see her back in 3 months for annual physical with pap smear.  Acne vulgaris    On minocycline 100 mg daily, sees dermatology for this.      I spent 30 minutes today reviewing paitent's history and medications and explaining the process of obtaining a pap smear and what the current recommendations are.   Return in about 3 months (around 03/28/2023) for annual physical with pap smear.   Farrel Conners, MD

## 2022-12-26 NOTE — Assessment & Plan Note (Signed)
Not heard on exam today, will continue to monitor. Will see her back in 3 months for annual physical with pap smear.

## 2022-12-26 NOTE — Assessment & Plan Note (Signed)
On minocycline 100 mg daily, sees dermatology for this.

## 2022-12-27 ENCOUNTER — Other Ambulatory Visit (HOSPITAL_BASED_OUTPATIENT_CLINIC_OR_DEPARTMENT_OTHER): Payer: Self-pay

## 2022-12-28 ENCOUNTER — Other Ambulatory Visit (HOSPITAL_BASED_OUTPATIENT_CLINIC_OR_DEPARTMENT_OTHER): Payer: Self-pay

## 2022-12-30 ENCOUNTER — Other Ambulatory Visit (HOSPITAL_BASED_OUTPATIENT_CLINIC_OR_DEPARTMENT_OTHER): Payer: Self-pay

## 2022-12-30 ENCOUNTER — Other Ambulatory Visit: Payer: Self-pay

## 2023-01-03 ENCOUNTER — Other Ambulatory Visit: Payer: Self-pay | Admitting: Family Medicine

## 2023-01-03 ENCOUNTER — Other Ambulatory Visit (HOSPITAL_BASED_OUTPATIENT_CLINIC_OR_DEPARTMENT_OTHER): Payer: Self-pay

## 2023-01-03 DIAGNOSIS — L700 Acne vulgaris: Secondary | ICD-10-CM

## 2023-01-03 MED ORDER — EPIDUO FORTE 0.3-2.5 % EX GEL
1.0000 | Freq: Every day | CUTANEOUS | 5 refills | Status: DC
  Filled 2023-01-03: qty 60, 30d supply, fill #0

## 2023-01-03 MED ORDER — CLINDAMYCIN PHOS-BENZOYL PEROX 1.2-5 % EX GEL
1.0000 | Freq: Two times a day (BID) | CUTANEOUS | 5 refills | Status: AC
  Filled 2023-01-03: qty 45, 30d supply, fill #0
  Filled 2023-03-21: qty 45, 30d supply, fill #1
  Filled 2023-04-29: qty 45, 30d supply, fill #2
  Filled 2023-05-31: qty 45, 30d supply, fill #3
  Filled 2023-07-06: qty 45, 30d supply, fill #4
  Filled 2023-08-05: qty 45, 30d supply, fill #5
  Filled ????-??-??: fill #3

## 2023-01-03 NOTE — Telephone Encounter (Signed)
Please advise on alternative gels

## 2023-01-03 NOTE — Telephone Encounter (Signed)
Please advise dose, sig, and refill amount. You are not original prescriber

## 2023-01-03 NOTE — Telephone Encounter (Signed)
I sent a refill in. 

## 2023-01-07 ENCOUNTER — Other Ambulatory Visit (HOSPITAL_BASED_OUTPATIENT_CLINIC_OR_DEPARTMENT_OTHER): Payer: Self-pay

## 2023-01-08 DIAGNOSIS — F419 Anxiety disorder, unspecified: Secondary | ICD-10-CM | POA: Diagnosis not present

## 2023-01-15 DIAGNOSIS — F419 Anxiety disorder, unspecified: Secondary | ICD-10-CM | POA: Diagnosis not present

## 2023-01-22 DIAGNOSIS — F419 Anxiety disorder, unspecified: Secondary | ICD-10-CM | POA: Diagnosis not present

## 2023-01-29 DIAGNOSIS — F419 Anxiety disorder, unspecified: Secondary | ICD-10-CM | POA: Diagnosis not present

## 2023-01-30 ENCOUNTER — Other Ambulatory Visit (HOSPITAL_BASED_OUTPATIENT_CLINIC_OR_DEPARTMENT_OTHER): Payer: Self-pay

## 2023-02-05 DIAGNOSIS — F419 Anxiety disorder, unspecified: Secondary | ICD-10-CM | POA: Diagnosis not present

## 2023-02-12 DIAGNOSIS — F419 Anxiety disorder, unspecified: Secondary | ICD-10-CM | POA: Diagnosis not present

## 2023-02-19 DIAGNOSIS — F419 Anxiety disorder, unspecified: Secondary | ICD-10-CM | POA: Diagnosis not present

## 2023-02-27 ENCOUNTER — Other Ambulatory Visit (HOSPITAL_BASED_OUTPATIENT_CLINIC_OR_DEPARTMENT_OTHER): Payer: Self-pay

## 2023-02-27 DIAGNOSIS — L700 Acne vulgaris: Secondary | ICD-10-CM | POA: Diagnosis not present

## 2023-02-27 MED ORDER — TRETINOIN 0.1 % EX CREA
TOPICAL_CREAM | CUTANEOUS | 2 refills | Status: AC
  Filled 2023-02-27: qty 20, 30d supply, fill #0
  Filled 2023-04-29: qty 20, 30d supply, fill #1
  Filled 2023-05-31: qty 20, 30d supply, fill #2
  Filled ????-??-??: fill #2

## 2023-02-27 MED ORDER — SULFAMETHOXAZOLE-TRIMETHOPRIM 800-160 MG PO TABS
1.0000 | ORAL_TABLET | Freq: Two times a day (BID) | ORAL | 0 refills | Status: DC
  Filled 2023-02-27: qty 60, 30d supply, fill #0

## 2023-02-28 ENCOUNTER — Encounter: Payer: Self-pay | Admitting: Family Medicine

## 2023-02-28 ENCOUNTER — Other Ambulatory Visit (HOSPITAL_COMMUNITY): Payer: Self-pay

## 2023-02-28 ENCOUNTER — Ambulatory Visit (INDEPENDENT_AMBULATORY_CARE_PROVIDER_SITE_OTHER): Payer: Commercial Managed Care - PPO | Admitting: Family Medicine

## 2023-02-28 ENCOUNTER — Other Ambulatory Visit (HOSPITAL_COMMUNITY)
Admission: RE | Admit: 2023-02-28 | Discharge: 2023-02-28 | Disposition: A | Discharge status: Home / Self Care | Payer: Commercial Managed Care - PPO | Source: Ambulatory Visit | Attending: *Deleted | Admitting: *Deleted

## 2023-02-28 ENCOUNTER — Encounter (HOSPITAL_BASED_OUTPATIENT_CLINIC_OR_DEPARTMENT_OTHER): Payer: Self-pay | Admitting: Pharmacist

## 2023-02-28 ENCOUNTER — Other Ambulatory Visit (HOSPITAL_BASED_OUTPATIENT_CLINIC_OR_DEPARTMENT_OTHER): Payer: Self-pay

## 2023-02-28 VITALS — Temp 98.0°F | Ht 70.0 in | Wt 179.1 lb

## 2023-02-28 DIAGNOSIS — F902 Attention-deficit hyperactivity disorder, combined type: Secondary | ICD-10-CM

## 2023-02-28 DIAGNOSIS — Z124 Encounter for screening for malignant neoplasm of cervix: Secondary | ICD-10-CM | POA: Diagnosis not present

## 2023-02-28 DIAGNOSIS — Z01419 Encounter for gynecological examination (general) (routine) without abnormal findings: Secondary | ICD-10-CM

## 2023-02-28 DIAGNOSIS — Z0000 Encounter for general adult medical examination without abnormal findings: Secondary | ICD-10-CM

## 2023-02-28 MED ORDER — DEXMETHYLPHENIDATE HCL ER 40 MG PO CP24
40.0000 mg | ORAL_CAPSULE | ORAL | 0 refills | Status: DC
Start: 2023-02-28 — End: 2023-03-03
  Filled 2023-02-28: qty 30, 30d supply, fill #0

## 2023-02-28 MED ORDER — DEXMETHYLPHENIDATE HCL ER 40 MG PO CP24
40.0000 mg | ORAL_CAPSULE | Freq: Every day | ORAL | 0 refills | Status: DC
  Filled 2023-02-28 – 2023-04-29 (×5): qty 30, 30d supply, fill #0

## 2023-02-28 NOTE — Patient Instructions (Signed)

## 2023-02-28 NOTE — Progress Notes (Signed)
Complete physical exam  Patient: Tammy Maldonado   DOB: Sep 30, 2001   22 y.o. Female  MRN: [id number]  Subjective:    Chief Complaint  Patient presents with   Annual Exam    Tammy Maldonado is a 22 y.o. female who presents today for a complete physical exam. She reports consuming a general diet.  Patient swims daily  She generally feels well. She reports sleeping fairly well. She does not have additional problems to discuss today.    Most recent fall risk assessment:     No data to display           Most recent depression screenings:    12/26/2022    1:02 PM  PHQ 2/9 Scores  PHQ - 2 Score 0    Vision:Within last year and Dental: No current dental problems and Receives regular dental care  Patient Active Problem List   Diagnosis Date Noted   Heart murmur 12/26/2022   Acne vulgaris 12/26/2022   Displacement of lumbar intervertebral disc without myelopathy 04/04/2022   Low back pain 03/05/2022   ADHD (attention deficit hyperactivity disorder), combined type 01/02/2016   Generalized anxiety disorder 01/02/2016   Dysgraphia 01/02/2016   Autism 10/04/2015      Patient Care Team: Karie Georges, MD as PCP - General (Family Medicine)   Outpatient Medications Prior to Visit  Medication Sig   Clindamycin-Benzoyl Per, Refr, gel Apply 1 Application topically 2 (two) times daily.   dexmethylphenidate (FOCALIN) 10 MG tablet Take 1 tablet by mouth at noon and 2 tablets at 3-5 PM for homework   dexmethylphenidate (FOCALIN) 10 MG tablet Take one tablet by mouth at noon time and 2 tablets between 3-5 pm daily for homework.   dexmethylphenidate (FOCALIN) 10 MG tablet Take one tablet by mouth at noon time and 2 tablets between 3-5 pm daily for homework.   Dexmethylphenidate HCl (FOCALIN XR) 40 MG CP24 Take 1 capsule (40 mg total) by mouth daily.   Dexmethylphenidate HCl (FOCALIN XR) 40 MG CP24 Take 1 capsule (40 mg total) by mouth daily.   levocetirizine (XYZAL) 5 MG  tablet Xyzal   minocycline (MINOCIN) 100 MG capsule    Multiple Vitamin (MULTIVITAMIN WITH MINERALS) TABS tablet Take 1 tablet by mouth daily.   sertraline (ZOLOFT) 100 MG tablet Take 1.5 tablets (150 mg total) by mouth daily.   sulfamethoxazole-trimethoprim (BACTRIM DS) 800-160 MG tablet Take 1 tablet by mouth 2 (two) times daily.   tretinoin (RETIN-A) 0.1 % cream 1(one) application(s) topical every day. Apply to Chin, Forehead, Upper Back   [DISCONTINUED] Dexmethylphenidate HCl (FOCALIN XR) 40 MG CP24 Take 1 capsule (40 mg total) by mouth every morning.   No facility-administered medications prior to visit.    Review of Systems  HENT:  Negative for hearing loss.   Eyes:  Negative for blurred vision.  Respiratory:  Negative for shortness of breath.   Cardiovascular:  Negative for chest pain.  Gastrointestinal: Negative.   Genitourinary: Negative.   Musculoskeletal:  Negative for back pain.  Neurological:  Negative for headaches.  Psychiatric/Behavioral:  Negative for depression.   All other systems reviewed and are negative.      Objective:     Temp 98 F (36.7 C) (Oral)   Ht 5\' 10"  (1.778 m)   Wt 179 lb 1.6 oz (81.2 kg)   LMP 02/18/2023 (Exact Date)   BMI 25.70 kg/m    Physical Exam Vitals reviewed. Exam conducted with a chaperone present.  Constitutional:  Appearance: Normal appearance. She is normal weight.  Cardiovascular:     Rate and Rhythm: Normal rate and regular rhythm.     Pulses: Normal pulses.     Heart sounds: Normal heart sounds.  Pulmonary:     Effort: Pulmonary effort is normal.     Breath sounds: Normal breath sounds.  Chest:     Chest wall: No mass.  Breasts:    Tanner Score is 5.     Right: Normal. No mass or tenderness.     Left: Normal. No mass or tenderness.  Abdominal:     General: Abdomen is flat. Bowel sounds are normal.     Palpations: Abdomen is soft.  Genitourinary:    General: Normal vulva.     Exam position: Lithotomy  position.     Tanner stage (genital): 5.     Vagina: Normal.     Cervix: Normal.     Uterus: Normal.      Adnexa: Right adnexa normal and left adnexa normal.     Rectum: Normal.  Lymphadenopathy:     Upper Body:     Right upper body: No axillary adenopathy.     Left upper body: No axillary adenopathy.  Neurological:     General: No focal deficit present.     Mental Status: She is alert and oriented to person, place, and time.  Psychiatric:        Mood and Affect: Mood and affect normal.      No results found for any visits on 02/28/23.     Assessment & Plan:    Routine Health Maintenance and Physical Exam  Immunization History  Administered Date(s) Administered   Influenza-Unspecified 07/21/2022   Moderna Sars-Covid-2 Vaccination 10/07/2020   PFIZER(Purple Top)SARS-COV-2 Vaccination 01/29/2020, 07/12/2021    Health Maintenance  Topic Date Due   HPV VACCINES (1 - 2-dose series) Never done   DTaP/Tdap/Td (1 - Tdap) Never done   COVID-19 Vaccine (4 - 2023-24 season) 06/21/2022   PAP-Cervical Cytology Screening  Never done   PAP SMEAR-Modifier  Never done   Hepatitis C Screening  12/26/2023 (Originally 11/20/2019)   HIV Screening  12/26/2023 (Originally 11/19/2016)   INFLUENZA VACCINE  05/22/2023    Discussed health benefits of physical activity, and encouraged her to engage in regular exercise appropriate for her age and condition.  ADHD (attention deficit hyperactivity disorder), combined type -     Dexmethylphenidate HCl ER; Take 1 capsule (40 mg total) by mouth every morning.  Dispense: 30 capsule; Refill: 0  Routine general medical examination at a health care facility  Well woman exam with routine gynecological exam Normal physical exam findings today, pap sent, handouts given on healthy eating an we discussed proper sleep patterns today. Cervical cancer screening -     Cytology - PAP    Return in 3 months (on 05/31/2023) for ADHD med refills-- ok to schedule  video visit.     Karie Georges, MD

## 2023-03-03 ENCOUNTER — Other Ambulatory Visit (HOSPITAL_BASED_OUTPATIENT_CLINIC_OR_DEPARTMENT_OTHER): Payer: Self-pay

## 2023-03-03 ENCOUNTER — Other Ambulatory Visit (HOSPITAL_COMMUNITY): Payer: Self-pay

## 2023-03-03 LAB — CYTOLOGY - PAP: Diagnosis: NEGATIVE

## 2023-03-03 MED ORDER — DEXMETHYLPHENIDATE HCL ER 40 MG PO CP24
40.0000 mg | ORAL_CAPSULE | Freq: Every day | ORAL | 0 refills | Status: DC
Start: 2023-03-03 — End: 2023-10-10
  Filled 2023-03-03: qty 30, 30d supply, fill #0
  Filled 2023-05-31 (×2): qty 10, 10d supply, fill #0
  Filled ????-??-??: fill #0

## 2023-03-03 MED ORDER — DEXMETHYLPHENIDATE HCL ER 40 MG PO CP24
40.0000 mg | ORAL_CAPSULE | ORAL | 0 refills | Status: DC
Start: 2023-03-03 — End: 2023-03-31
  Filled 2023-03-03: qty 30, 30d supply, fill #0

## 2023-03-03 NOTE — Telephone Encounter (Signed)
Pt call back and stated she want it sent to [facility] because [facility] pharmacy is on back order.

## 2023-03-03 NOTE — Telephone Encounter (Signed)
Pt called and asked this be sent to [facility] pharmacy on [address]

## 2023-03-03 NOTE — Telephone Encounter (Signed)
Sent!

## 2023-03-03 NOTE — Telephone Encounter (Signed)
Script sent  

## 2023-03-03 NOTE — Telephone Encounter (Signed)
Patient now requesting this medication go to [facility] pharmacy [address]

## 2023-03-05 DIAGNOSIS — F419 Anxiety disorder, unspecified: Secondary | ICD-10-CM | POA: Diagnosis not present

## 2023-03-12 DIAGNOSIS — F419 Anxiety disorder, unspecified: Secondary | ICD-10-CM | POA: Diagnosis not present

## 2023-03-19 DIAGNOSIS — F419 Anxiety disorder, unspecified: Secondary | ICD-10-CM | POA: Diagnosis not present

## 2023-03-21 ENCOUNTER — Other Ambulatory Visit: Payer: Self-pay

## 2023-03-22 ENCOUNTER — Other Ambulatory Visit (HOSPITAL_BASED_OUTPATIENT_CLINIC_OR_DEPARTMENT_OTHER): Payer: Self-pay

## 2023-03-22 ENCOUNTER — Other Ambulatory Visit: Payer: Self-pay

## 2023-03-27 DIAGNOSIS — F419 Anxiety disorder, unspecified: Secondary | ICD-10-CM | POA: Diagnosis not present

## 2023-03-31 ENCOUNTER — Other Ambulatory Visit: Payer: Self-pay | Admitting: Family Medicine

## 2023-03-31 ENCOUNTER — Other Ambulatory Visit (HOSPITAL_BASED_OUTPATIENT_CLINIC_OR_DEPARTMENT_OTHER): Payer: Self-pay

## 2023-03-31 ENCOUNTER — Other Ambulatory Visit: Payer: Self-pay

## 2023-03-31 DIAGNOSIS — F902 Attention-deficit hyperactivity disorder, combined type: Secondary | ICD-10-CM

## 2023-03-31 MED ORDER — DEXMETHYLPHENIDATE HCL ER 40 MG PO CP24
40.0000 mg | ORAL_CAPSULE | ORAL | 0 refills | Status: DC
Start: 2023-03-31 — End: 2023-06-02
  Filled 2023-03-31: qty 30, 30d supply, fill #0

## 2023-04-03 ENCOUNTER — Institutional Professional Consult (permissible substitution): Payer: BC Managed Care – PPO | Admitting: Family

## 2023-04-05 DIAGNOSIS — F419 Anxiety disorder, unspecified: Secondary | ICD-10-CM | POA: Diagnosis not present

## 2023-04-10 DIAGNOSIS — F419 Anxiety disorder, unspecified: Secondary | ICD-10-CM | POA: Diagnosis not present

## 2023-04-14 ENCOUNTER — Other Ambulatory Visit (HOSPITAL_BASED_OUTPATIENT_CLINIC_OR_DEPARTMENT_OTHER): Payer: Self-pay

## 2023-04-18 DIAGNOSIS — F419 Anxiety disorder, unspecified: Secondary | ICD-10-CM | POA: Diagnosis not present

## 2023-04-29 ENCOUNTER — Other Ambulatory Visit (HOSPITAL_BASED_OUTPATIENT_CLINIC_OR_DEPARTMENT_OTHER): Payer: Self-pay

## 2023-04-29 ENCOUNTER — Other Ambulatory Visit: Payer: Self-pay

## 2023-05-01 DIAGNOSIS — F419 Anxiety disorder, unspecified: Secondary | ICD-10-CM | POA: Diagnosis not present

## 2023-05-03 ENCOUNTER — Other Ambulatory Visit: Payer: Self-pay

## 2023-05-03 ENCOUNTER — Other Ambulatory Visit (HOSPITAL_BASED_OUTPATIENT_CLINIC_OR_DEPARTMENT_OTHER): Payer: Self-pay

## 2023-05-13 ENCOUNTER — Other Ambulatory Visit (HOSPITAL_BASED_OUTPATIENT_CLINIC_OR_DEPARTMENT_OTHER): Payer: Self-pay

## 2023-05-14 ENCOUNTER — Other Ambulatory Visit (HOSPITAL_BASED_OUTPATIENT_CLINIC_OR_DEPARTMENT_OTHER): Payer: Self-pay

## 2023-05-20 DIAGNOSIS — F419 Anxiety disorder, unspecified: Secondary | ICD-10-CM | POA: Diagnosis not present

## 2023-05-28 DIAGNOSIS — F419 Anxiety disorder, unspecified: Secondary | ICD-10-CM | POA: Diagnosis not present

## 2023-05-31 ENCOUNTER — Other Ambulatory Visit (HOSPITAL_BASED_OUTPATIENT_CLINIC_OR_DEPARTMENT_OTHER): Payer: Self-pay

## 2023-06-02 ENCOUNTER — Other Ambulatory Visit: Payer: Self-pay

## 2023-06-02 ENCOUNTER — Telehealth (INDEPENDENT_AMBULATORY_CARE_PROVIDER_SITE_OTHER): Payer: Commercial Managed Care - PPO | Admitting: Family Medicine

## 2023-06-02 ENCOUNTER — Other Ambulatory Visit (HOSPITAL_BASED_OUTPATIENT_CLINIC_OR_DEPARTMENT_OTHER): Payer: Self-pay

## 2023-06-02 ENCOUNTER — Encounter (HOSPITAL_BASED_OUTPATIENT_CLINIC_OR_DEPARTMENT_OTHER): Payer: Self-pay

## 2023-06-02 ENCOUNTER — Encounter: Payer: Self-pay | Admitting: Family Medicine

## 2023-06-02 DIAGNOSIS — F902 Attention-deficit hyperactivity disorder, combined type: Secondary | ICD-10-CM

## 2023-06-02 MED ORDER — DEXMETHYLPHENIDATE HCL ER 40 MG PO CP24
40.0000 mg | ORAL_CAPSULE | ORAL | 0 refills | Status: DC
Start: 2023-06-02 — End: 2023-07-06
  Filled 2023-06-02 – 2023-06-09 (×2): qty 30, 30d supply, fill #0

## 2023-06-02 MED ORDER — DEXMETHYLPHENIDATE HCL 10 MG PO TABS
ORAL_TABLET | ORAL | 0 refills | Status: DC
Start: 2023-06-02 — End: 2023-06-13
  Filled 2023-06-02: qty 90, 30d supply, fill #0

## 2023-06-02 NOTE — Progress Notes (Signed)
   Virtual Medical Office Visit  Patient:  Tammy Maldonado      Age: 22 y.o.       Sex:  female  Date:   06/02/2023  PCP:    Karie Georges, MD   Today's Healthcare Provider: Karie Georges, MD    Assessment/Plan:   Summary assessment:  Tammy Maldonado was seen today for medical management of chronic issues.  ADHD (attention deficit hyperactivity disorder), combined type Assessment & Plan: Symptoms are well controlled on the 40 mg XR focalin in the morning, and 20-30 mg of the short acting in the afternoon. I have refilled her medications. RTC in 3 months for video visit for medication refills.   Orders: -     Dexmethylphenidate HCl ER; Take 1 capsule (40 mg total) by mouth every morning.  Dispense: 30 capsule; Refill: 0 -     Dexmethylphenidate HCl; Take one tablet by mouth at noon time and 2 tablets between 3-5 pm daily for homework.  Dispense: 90 tablet; Refill: 0     Return in about 3 months (around 09/02/2023) for video visit for med refills. .   She was advised to call the office or go to ER if her condition worsens    Subjective:   Tammy Maldonado is a 22 y.o. female with PMH significant for: Past Medical History:  Diagnosis Date   ADHD    ADHD (attention deficit hyperactivity disorder)    Allergy    Anxiety    Asthma    Constipation    Eosinophilic esophagitis    GERD (gastroesophageal reflux disease)    IBS (irritable bowel syndrome)    Otitis media    infant   Vision abnormalities    glasses, near sighted     Presenting today with: Chief Complaint  Patient presents with   Medical Management of Chronic Issues     She clarifies and reports that her condition: Patient is requesting ADHD medication refills. She reports she is doing well on the 40 mg XR in the morning. States that she will sometimes that 2 tablets of the 10 mg focalin in the afternoon, sometimes she needs 3 tablets. She denies any side effects to the medication.   She denies  having any: Anxiety, heart palpitations, no dry mouth,           Objective/Observations  Physical Exam:  Polite and friendly Gen: NAD, resting comfortably Pulm: Normal work of breathing Neuro: Grossly normal, moves all extremities Psych: Normal affect and thought content Problem specific physical exam findings:    No images are attached to the encounter or orders placed in the encounter.    Results: No results found for any visits on 06/02/23.   No results found for this or any previous visit (from the past 2160 hour(s)).         Virtual Visit via Video   I connected with Tammy Maldonado on 06/02/23 at 11:00 AM EDT by a video enabled telemedicine application and verified that I am speaking with the correct person using two identifiers. The limitations of evaluation and management by telemedicine and the availability of in person appointments were discussed. The patient expressed understanding and agreed to proceed.   Percentage of appointment time on video:  100% Patient location: Home Provider location: [facility] Persons participating in the virtual visit: Myself and Patient

## 2023-06-02 NOTE — Assessment & Plan Note (Signed)
Symptoms are well controlled on the 40 mg XR focalin in the morning, and 20-30 mg of the short acting in the afternoon. I have refilled her medications. RTC in 3 months for video visit for medication refills.

## 2023-06-06 ENCOUNTER — Other Ambulatory Visit (HOSPITAL_BASED_OUTPATIENT_CLINIC_OR_DEPARTMENT_OTHER): Payer: Self-pay

## 2023-06-06 DIAGNOSIS — F419 Anxiety disorder, unspecified: Secondary | ICD-10-CM | POA: Diagnosis not present

## 2023-06-09 ENCOUNTER — Other Ambulatory Visit (HOSPITAL_BASED_OUTPATIENT_CLINIC_OR_DEPARTMENT_OTHER): Payer: Self-pay

## 2023-06-10 ENCOUNTER — Other Ambulatory Visit (HOSPITAL_BASED_OUTPATIENT_CLINIC_OR_DEPARTMENT_OTHER): Payer: Self-pay

## 2023-06-10 ENCOUNTER — Telehealth: Payer: Self-pay

## 2023-06-10 ENCOUNTER — Other Ambulatory Visit (HOSPITAL_COMMUNITY): Payer: Self-pay

## 2023-06-10 DIAGNOSIS — F902 Attention-deficit hyperactivity disorder, combined type: Secondary | ICD-10-CM

## 2023-06-10 DIAGNOSIS — L700 Acne vulgaris: Secondary | ICD-10-CM | POA: Diagnosis not present

## 2023-06-10 NOTE — Telephone Encounter (Signed)
Pharmacy Patient Advocate Encounter   Received notification from CoverMyMeds that prior authorization for Dexmethylphenidate 10mg   is required/requested.   Insurance verification completed.   The patient is insured through [employer] .   Per test claim: PA required; PA submitted to [facility] via CoverMyMeds Key/confirmation #/EOC [id number] Status is pending

## 2023-06-11 ENCOUNTER — Other Ambulatory Visit: Payer: Self-pay

## 2023-06-11 DIAGNOSIS — F419 Anxiety disorder, unspecified: Secondary | ICD-10-CM | POA: Diagnosis not present

## 2023-06-12 NOTE — Telephone Encounter (Signed)
Received fax for additional information. Insurance requires 2 high quality articles from major peer-reviewed medical journals that support the safety and efficacy of the requested drug at the intended dosage for the specified indication. Attached form to patient's chart for provider/office.

## 2023-06-13 ENCOUNTER — Other Ambulatory Visit (HOSPITAL_BASED_OUTPATIENT_CLINIC_OR_DEPARTMENT_OTHER): Payer: Self-pay

## 2023-06-13 NOTE — Telephone Encounter (Signed)
So I do not have the 2 articles they are requiring, please let pt know that her insurance will not cover 3 tablets of the focalin daily. Would she be ok with switching the afternoon pills to methylphenidate 20 mg tablets and she can take 1 1/2 tablets in the afternoon? I'm not sure how her previous provider got them approved -- if she doesn't want to switch the medication she may need to go back to her ADHD provider for the prescriptions.

## 2023-06-13 NOTE — Telephone Encounter (Signed)
Left pt a message advised to call the office regarding this message

## 2023-06-17 NOTE — Telephone Encounter (Signed)
Send pt Dr Kelton Pillar recommendation via MyChart

## 2023-06-19 DIAGNOSIS — F419 Anxiety disorder, unspecified: Secondary | ICD-10-CM | POA: Diagnosis not present

## 2023-06-21 ENCOUNTER — Other Ambulatory Visit (HOSPITAL_BASED_OUTPATIENT_CLINIC_OR_DEPARTMENT_OTHER): Payer: Self-pay

## 2023-06-25 DIAGNOSIS — F419 Anxiety disorder, unspecified: Secondary | ICD-10-CM | POA: Diagnosis not present

## 2023-07-02 ENCOUNTER — Encounter: Payer: Self-pay | Admitting: Family Medicine

## 2023-07-02 DIAGNOSIS — F419 Anxiety disorder, unspecified: Secondary | ICD-10-CM | POA: Diagnosis not present

## 2023-07-02 DIAGNOSIS — F902 Attention-deficit hyperactivity disorder, combined type: Secondary | ICD-10-CM

## 2023-07-03 ENCOUNTER — Other Ambulatory Visit (HOSPITAL_BASED_OUTPATIENT_CLINIC_OR_DEPARTMENT_OTHER): Payer: Self-pay

## 2023-07-03 MED ORDER — METHYLPHENIDATE HCL 20 MG PO TABS
30.0000 mg | ORAL_TABLET | Freq: Every day | ORAL | 0 refills | Status: DC
Start: 2023-07-03 — End: 2023-08-07
  Filled 2023-07-03: qty 45, 30d supply, fill #0

## 2023-07-06 ENCOUNTER — Other Ambulatory Visit: Payer: Self-pay

## 2023-07-06 ENCOUNTER — Other Ambulatory Visit: Payer: Self-pay | Admitting: Family Medicine

## 2023-07-06 DIAGNOSIS — F902 Attention-deficit hyperactivity disorder, combined type: Secondary | ICD-10-CM

## 2023-07-07 ENCOUNTER — Other Ambulatory Visit: Payer: Self-pay

## 2023-07-07 ENCOUNTER — Other Ambulatory Visit (HOSPITAL_BASED_OUTPATIENT_CLINIC_OR_DEPARTMENT_OTHER): Payer: Self-pay

## 2023-07-07 MED ORDER — DEXMETHYLPHENIDATE HCL ER 40 MG PO CP24
40.0000 mg | ORAL_CAPSULE | ORAL | 0 refills | Status: DC
Start: 2023-07-07 — End: 2023-12-05
  Filled 2023-07-07 – 2023-08-05 (×2): qty 30, 30d supply, fill #0

## 2023-07-08 ENCOUNTER — Other Ambulatory Visit: Payer: Self-pay

## 2023-07-08 ENCOUNTER — Other Ambulatory Visit (HOSPITAL_BASED_OUTPATIENT_CLINIC_OR_DEPARTMENT_OTHER): Payer: Self-pay

## 2023-07-09 ENCOUNTER — Encounter: Payer: Self-pay | Admitting: Family Medicine

## 2023-07-09 ENCOUNTER — Other Ambulatory Visit (HOSPITAL_BASED_OUTPATIENT_CLINIC_OR_DEPARTMENT_OTHER): Payer: Self-pay

## 2023-07-09 MED ORDER — DEXMETHYLPHENIDATE HCL ER 40 MG PO CP24: 40.0000 mg | ORAL_CAPSULE | Freq: Every day | ORAL | 0 refills | Status: DC

## 2023-07-10 DIAGNOSIS — F419 Anxiety disorder, unspecified: Secondary | ICD-10-CM | POA: Diagnosis not present

## 2023-07-16 ENCOUNTER — Encounter: Payer: Self-pay | Admitting: Family Medicine

## 2023-07-16 ENCOUNTER — Other Ambulatory Visit (HOSPITAL_BASED_OUTPATIENT_CLINIC_OR_DEPARTMENT_OTHER): Payer: Self-pay

## 2023-07-16 MED ORDER — SERTRALINE HCL 100 MG PO TABS
150.0000 mg | ORAL_TABLET | Freq: Every day | ORAL | 1 refills | Status: DC
  Filled 2023-07-16: qty 135, 90d supply, fill #0

## 2023-07-21 ENCOUNTER — Other Ambulatory Visit (HOSPITAL_BASED_OUTPATIENT_CLINIC_OR_DEPARTMENT_OTHER): Payer: Self-pay

## 2023-07-21 MED ORDER — SERTRALINE HCL 100 MG PO TABS
150.0000 mg | ORAL_TABLET | Freq: Every day | ORAL | 3 refills | Status: DC
  Filled 2023-07-21 – 2023-10-06 (×2): qty 45, 30d supply, fill #0
  Filled 2023-11-09: qty 45, 30d supply, fill #1
  Filled 2023-12-15: qty 45, 30d supply, fill #2

## 2023-07-21 NOTE — Telephone Encounter (Signed)
Ok to refill the sertraline

## 2023-07-23 DIAGNOSIS — F419 Anxiety disorder, unspecified: Secondary | ICD-10-CM | POA: Diagnosis not present

## 2023-07-23 NOTE — Telephone Encounter (Signed)
Placed on your desk, ok to close task

## 2023-07-24 ENCOUNTER — Encounter: Payer: Self-pay | Admitting: Family Medicine

## 2023-07-30 DIAGNOSIS — F419 Anxiety disorder, unspecified: Secondary | ICD-10-CM | POA: Diagnosis not present

## 2023-08-05 ENCOUNTER — Other Ambulatory Visit: Payer: Self-pay | Admitting: Family Medicine

## 2023-08-05 ENCOUNTER — Other Ambulatory Visit: Payer: Self-pay

## 2023-08-05 ENCOUNTER — Other Ambulatory Visit (HOSPITAL_BASED_OUTPATIENT_CLINIC_OR_DEPARTMENT_OTHER): Payer: Self-pay

## 2023-08-05 DIAGNOSIS — F902 Attention-deficit hyperactivity disorder, combined type: Secondary | ICD-10-CM

## 2023-08-06 ENCOUNTER — Encounter: Payer: Self-pay | Admitting: Family Medicine

## 2023-08-06 ENCOUNTER — Other Ambulatory Visit (HOSPITAL_BASED_OUTPATIENT_CLINIC_OR_DEPARTMENT_OTHER): Payer: Self-pay

## 2023-08-06 DIAGNOSIS — F902 Attention-deficit hyperactivity disorder, combined type: Secondary | ICD-10-CM

## 2023-08-07 ENCOUNTER — Other Ambulatory Visit: Payer: Self-pay

## 2023-08-07 ENCOUNTER — Other Ambulatory Visit (HOSPITAL_BASED_OUTPATIENT_CLINIC_OR_DEPARTMENT_OTHER): Payer: Self-pay

## 2023-08-07 MED ORDER — DEXMETHYLPHENIDATE HCL ER 40 MG PO CP24
40.0000 mg | ORAL_CAPSULE | Freq: Every day | ORAL | 0 refills | Status: DC
  Filled 2023-08-07 – 2023-09-05 (×2): qty 30, 30d supply, fill #0

## 2023-08-07 MED ORDER — METHYLPHENIDATE HCL 20 MG PO TABS
30.0000 mg | ORAL_TABLET | Freq: Every day | ORAL | 0 refills | Status: DC
Start: 2023-08-07 — End: 2023-10-06
  Filled 2023-08-07 (×2): qty 45, 30d supply, fill #0

## 2023-08-13 DIAGNOSIS — F419 Anxiety disorder, unspecified: Secondary | ICD-10-CM | POA: Diagnosis not present

## 2023-08-21 ENCOUNTER — Other Ambulatory Visit: Payer: Self-pay

## 2023-08-28 DIAGNOSIS — F419 Anxiety disorder, unspecified: Secondary | ICD-10-CM | POA: Diagnosis not present

## 2023-09-06 ENCOUNTER — Other Ambulatory Visit (HOSPITAL_BASED_OUTPATIENT_CLINIC_OR_DEPARTMENT_OTHER): Payer: Self-pay

## 2023-09-09 DIAGNOSIS — F419 Anxiety disorder, unspecified: Secondary | ICD-10-CM | POA: Diagnosis not present

## 2023-09-10 DIAGNOSIS — F419 Anxiety disorder, unspecified: Secondary | ICD-10-CM | POA: Diagnosis not present

## 2023-09-16 DIAGNOSIS — F411 Generalized anxiety disorder: Secondary | ICD-10-CM | POA: Diagnosis not present

## 2023-09-25 DIAGNOSIS — F411 Generalized anxiety disorder: Secondary | ICD-10-CM | POA: Diagnosis not present

## 2023-10-02 DIAGNOSIS — F411 Generalized anxiety disorder: Secondary | ICD-10-CM | POA: Diagnosis not present

## 2023-10-06 ENCOUNTER — Other Ambulatory Visit: Payer: Self-pay | Admitting: Family Medicine

## 2023-10-06 ENCOUNTER — Encounter: Payer: Self-pay | Admitting: Family Medicine

## 2023-10-06 ENCOUNTER — Other Ambulatory Visit (HOSPITAL_BASED_OUTPATIENT_CLINIC_OR_DEPARTMENT_OTHER): Payer: Self-pay

## 2023-10-06 ENCOUNTER — Other Ambulatory Visit: Payer: Self-pay

## 2023-10-06 DIAGNOSIS — F902 Attention-deficit hyperactivity disorder, combined type: Secondary | ICD-10-CM

## 2023-10-06 MED ORDER — DEXMETHYLPHENIDATE HCL ER 40 MG PO CP24
40.0000 mg | ORAL_CAPSULE | Freq: Every day | ORAL | 0 refills | Status: DC
  Filled 2023-10-06: qty 30, 30d supply, fill #0

## 2023-10-06 MED ORDER — METHYLPHENIDATE HCL 20 MG PO TABS
30.0000 mg | ORAL_TABLET | Freq: Every day | ORAL | 0 refills | Status: DC
  Filled 2023-10-06: qty 45, 30d supply, fill #0

## 2023-10-10 ENCOUNTER — Other Ambulatory Visit (HOSPITAL_BASED_OUTPATIENT_CLINIC_OR_DEPARTMENT_OTHER): Payer: Self-pay

## 2023-10-10 ENCOUNTER — Ambulatory Visit: Payer: Commercial Managed Care - PPO | Admitting: Family Medicine

## 2023-10-10 ENCOUNTER — Encounter: Payer: Self-pay | Admitting: Family Medicine

## 2023-10-10 VITALS — BP 100/62 | HR 60 | Temp 98.3°F | Ht 70.0 in | Wt 160.6 lb

## 2023-10-10 DIAGNOSIS — F902 Attention-deficit hyperactivity disorder, combined type: Secondary | ICD-10-CM

## 2023-10-10 MED ORDER — DEXMETHYLPHENIDATE HCL ER 40 MG PO CP24
40.0000 mg | ORAL_CAPSULE | Freq: Every day | ORAL | 0 refills | Status: DC
  Filled 2023-12-31 – 2024-01-02 (×2): qty 30, 30d supply, fill #0

## 2023-10-10 MED ORDER — DEXMETHYLPHENIDATE HCL ER 40 MG PO CP24: 40.0000 mg | ORAL_CAPSULE | Freq: Every day | ORAL | 0 refills | Status: DC

## 2023-10-10 MED ORDER — METHYLPHENIDATE HCL 20 MG PO TABS
30.0000 mg | ORAL_TABLET | Freq: Every day | ORAL | 0 refills | Status: DC
  Filled 2023-11-07: qty 45, 30d supply, fill #0

## 2023-10-10 MED ORDER — METHYLPHENIDATE HCL 20 MG PO TABS
30.0000 mg | ORAL_TABLET | Freq: Every day | ORAL | 0 refills | Status: DC
  Filled 2023-12-05: qty 45, 30d supply, fill #0

## 2023-10-10 MED ORDER — METHYLPHENIDATE HCL 20 MG PO TABS
30.0000 mg | ORAL_TABLET | Freq: Every day | ORAL | 0 refills | Status: DC
  Filled 2023-12-31 – 2024-01-12 (×2): qty 45, 30d supply, fill #0

## 2023-10-10 MED ORDER — DEXMETHYLPHENIDATE HCL ER 40 MG PO CP24
40.0000 mg | ORAL_CAPSULE | Freq: Every day | ORAL | 0 refills | Status: DC
  Filled 2023-11-07: qty 30, 30d supply, fill #0

## 2023-10-10 NOTE — Progress Notes (Signed)
Established Patient Office Visit  Subjective   Patient ID: Tammy Maldonado, female    DOB: 15-Jul-2001  Age: 22 y.o. MRN: [id number]  Chief Complaint  Patient presents with   Medical Management of Chronic Issues    Pt is here for follow up on ADHD medications. She reports that they are working well for her, she denies any side effects to the medications. She is returning to school and will be graduating this year.     Current Outpatient Medications  Medication Instructions   Clindamycin-Benzoyl Per, Refr, gel 1 Application, Topical, 2 times daily   Dexmethylphenidate HCl (FOCALIN XR) 40 mg, Oral, BH-each morning   [START ON 11/07/2023] Dexmethylphenidate HCl (FOCALIN XR) 40 mg, Oral, Daily   [START ON 12/08/2023] Dexmethylphenidate HCl (FOCALIN XR) 40 mg, Oral, Daily   [START ON 12/29/2023] Dexmethylphenidate HCl (FOCALIN XR) 40 mg, Oral, Daily   levocetirizine (XYZAL) 5 MG tablet Xyzal   [START ON 11/07/2023] methylphenidate (RITALIN) 30 mg, Oral, Daily after lunch   [START ON 12/05/2023] methylphenidate (RITALIN) 30 mg, Oral, Daily after lunch   [START ON 12/29/2023] methylphenidate (RITALIN) 30 mg, Oral, Daily after lunch   minocycline (MINOCIN) 100 MG capsule    Multiple Vitamin (MULTIVITAMIN WITH MINERALS) TABS tablet 1 tablet, Daily   sertraline (ZOLOFT) 150 mg, Oral, Daily   tretinoin (RETIN-A) 0.1 % cream 1(one) application(s) topical every day. Apply to Nolen Mu, Upper Back    Patient Active Problem List   Diagnosis Date Noted   Heart murmur 12/26/2022   Acne vulgaris 12/26/2022   Displacement of lumbar intervertebral disc without myelopathy 04/04/2022   Low back pain 03/05/2022   ADHD (attention deficit hyperactivity disorder), combined type 01/02/2016   Generalized anxiety disorder 01/02/2016   Dysgraphia 01/02/2016   Autism 10/04/2015      Review of Systems  All other systems reviewed and are negative.     Objective:     BP 100/62   Pulse 60    Temp 98.3 F (36.8 C) (Oral)   Ht 5\' 10"  (1.778 m)   Wt 160 lb 9.6 oz (72.8 kg)   LMP 10/02/2023   SpO2 100%   BMI 23.04 kg/m    Physical Exam Vitals reviewed.  Constitutional:      Appearance: Normal appearance. She is well-groomed and normal weight.  Neck:     Thyroid: No thyromegaly.  Cardiovascular:     Rate and Rhythm: Normal rate and regular rhythm.     Heart sounds: S1 normal and S2 normal.  Pulmonary:     Effort: Pulmonary effort is normal.     Breath sounds: Normal breath sounds and air entry.  Neurological:     Mental Status: She is alert and oriented to person, place, and time. Mental status is at baseline.     Gait: Gait is intact.  Psychiatric:        Mood and Affect: Mood and affect normal.        Speech: Speech normal.        Behavior: Behavior normal.        Judgment: Judgment normal.     No results found for any visits on 10/10/23.    The ASCVD Risk score (Arnett DK, et al., 2019) failed to calculate for the following reasons:   The 2019 ASCVD risk score is only valid for ages 64 to 71    Assessment & Plan:  ADHD (attention deficit hyperactivity disorder), combined type Assessment & Plan: Symptoms are well controlled  on the 40 mg XR focalin in the morning, and 20-30 mg of the short acting in the afternoon. I have refilled her medications. RTC in 5 months for annual physical exam.   Orders: -     Dexmethylphenidate HCl ER; Take 1 capsule (40 mg total) by mouth daily.  Dispense: 30 capsule; Refill: 0 -     Methylphenidate HCl; Take 1.5 tablets (30 mg total) by mouth daily after lunch.  Dispense: 45 tablet; Refill: 0 -     Methylphenidate HCl; Take 1.5 tablets (30 mg total) by mouth daily after lunch.  Dispense: 45 tablet; Refill: 0 -     Methylphenidate HCl; Take 1.5 tablets (30 mg total) by mouth daily after lunch.  Dispense: 45 tablet; Refill: 0 -     Dexmethylphenidate HCl ER; Take 1 capsule (40 mg total) by mouth daily.  Dispense: 30 capsule;  Refill: 0 -     Dexmethylphenidate HCl ER; Take 1 capsule (40 mg total) by mouth daily.  Dispense: 30 capsule; Refill: 0     Return in about 5 months (around 03/09/2024) for annual physical exam.    Karie Georges, MD

## 2023-10-10 NOTE — Assessment & Plan Note (Signed)
Symptoms are well controlled on the 40 mg XR focalin in the morning, and 20-30 mg of the short acting in the afternoon. I have refilled her medications. RTC in 5 months for annual physical exam.

## 2023-10-24 DIAGNOSIS — F411 Generalized anxiety disorder: Secondary | ICD-10-CM | POA: Diagnosis not present

## 2023-10-28 DIAGNOSIS — F411 Generalized anxiety disorder: Secondary | ICD-10-CM | POA: Diagnosis not present

## 2023-11-04 ENCOUNTER — Other Ambulatory Visit (HOSPITAL_BASED_OUTPATIENT_CLINIC_OR_DEPARTMENT_OTHER): Payer: Self-pay

## 2023-11-07 ENCOUNTER — Other Ambulatory Visit (HOSPITAL_BASED_OUTPATIENT_CLINIC_OR_DEPARTMENT_OTHER): Payer: Self-pay

## 2023-11-10 DIAGNOSIS — F411 Generalized anxiety disorder: Secondary | ICD-10-CM | POA: Diagnosis not present

## 2023-11-18 DIAGNOSIS — F411 Generalized anxiety disorder: Secondary | ICD-10-CM | POA: Diagnosis not present

## 2023-11-29 DIAGNOSIS — F411 Generalized anxiety disorder: Secondary | ICD-10-CM | POA: Diagnosis not present

## 2023-12-04 ENCOUNTER — Other Ambulatory Visit (HOSPITAL_BASED_OUTPATIENT_CLINIC_OR_DEPARTMENT_OTHER): Payer: Self-pay

## 2023-12-04 ENCOUNTER — Encounter: Payer: Self-pay | Admitting: Family Medicine

## 2023-12-04 DIAGNOSIS — F902 Attention-deficit hyperactivity disorder, combined type: Secondary | ICD-10-CM

## 2023-12-04 DIAGNOSIS — F411 Generalized anxiety disorder: Secondary | ICD-10-CM | POA: Diagnosis not present

## 2023-12-05 ENCOUNTER — Other Ambulatory Visit: Payer: Self-pay

## 2023-12-05 ENCOUNTER — Other Ambulatory Visit (HOSPITAL_BASED_OUTPATIENT_CLINIC_OR_DEPARTMENT_OTHER): Payer: Self-pay

## 2023-12-05 MED ORDER — DEXMETHYLPHENIDATE HCL ER 40 MG PO CP24
40.0000 mg | ORAL_CAPSULE | Freq: Every day | ORAL | 0 refills | Status: DC
  Filled 2023-12-05: qty 30, 30d supply, fill #0

## 2023-12-06 ENCOUNTER — Other Ambulatory Visit (HOSPITAL_BASED_OUTPATIENT_CLINIC_OR_DEPARTMENT_OTHER): Payer: Self-pay

## 2023-12-12 DIAGNOSIS — F411 Generalized anxiety disorder: Secondary | ICD-10-CM | POA: Diagnosis not present

## 2023-12-16 DIAGNOSIS — F411 Generalized anxiety disorder: Secondary | ICD-10-CM | POA: Diagnosis not present

## 2023-12-23 ENCOUNTER — Other Ambulatory Visit (HOSPITAL_BASED_OUTPATIENT_CLINIC_OR_DEPARTMENT_OTHER): Payer: Self-pay

## 2023-12-23 DIAGNOSIS — B070 Plantar wart: Secondary | ICD-10-CM | POA: Diagnosis not present

## 2023-12-23 DIAGNOSIS — F411 Generalized anxiety disorder: Secondary | ICD-10-CM | POA: Diagnosis not present

## 2023-12-23 MED ORDER — CEPHALEXIN 500 MG PO CAPS
500.0000 mg | ORAL_CAPSULE | Freq: Two times a day (BID) | ORAL | 0 refills | Status: DC
  Filled 2023-12-23: qty 14, 7d supply, fill #0

## 2023-12-31 ENCOUNTER — Other Ambulatory Visit (HOSPITAL_BASED_OUTPATIENT_CLINIC_OR_DEPARTMENT_OTHER): Payer: Self-pay

## 2024-01-01 ENCOUNTER — Other Ambulatory Visit (HOSPITAL_BASED_OUTPATIENT_CLINIC_OR_DEPARTMENT_OTHER): Payer: Self-pay

## 2024-01-01 ENCOUNTER — Other Ambulatory Visit: Payer: Self-pay

## 2024-01-02 ENCOUNTER — Other Ambulatory Visit (HOSPITAL_BASED_OUTPATIENT_CLINIC_OR_DEPARTMENT_OTHER): Payer: Self-pay

## 2024-01-02 ENCOUNTER — Other Ambulatory Visit: Payer: Self-pay

## 2024-01-03 DIAGNOSIS — F411 Generalized anxiety disorder: Secondary | ICD-10-CM | POA: Diagnosis not present

## 2024-01-06 DIAGNOSIS — F411 Generalized anxiety disorder: Secondary | ICD-10-CM | POA: Diagnosis not present

## 2024-01-07 ENCOUNTER — Other Ambulatory Visit (HOSPITAL_BASED_OUTPATIENT_CLINIC_OR_DEPARTMENT_OTHER): Payer: Self-pay

## 2024-01-07 ENCOUNTER — Encounter: Payer: Self-pay | Admitting: Family Medicine

## 2024-01-07 MED ORDER — SERTRALINE HCL 100 MG PO TABS
150.0000 mg | ORAL_TABLET | Freq: Every day | ORAL | 1 refills | Status: DC
  Filled 2024-01-07 – 2024-01-08 (×2): qty 45, 30d supply, fill #0
  Filled 2024-01-31: qty 45, 30d supply, fill #1

## 2024-01-08 ENCOUNTER — Other Ambulatory Visit (HOSPITAL_BASED_OUTPATIENT_CLINIC_OR_DEPARTMENT_OTHER): Payer: Self-pay

## 2024-01-08 DIAGNOSIS — F411 Generalized anxiety disorder: Secondary | ICD-10-CM | POA: Diagnosis not present

## 2024-01-12 ENCOUNTER — Other Ambulatory Visit (HOSPITAL_BASED_OUTPATIENT_CLINIC_OR_DEPARTMENT_OTHER): Payer: Self-pay

## 2024-01-19 DIAGNOSIS — F411 Generalized anxiety disorder: Secondary | ICD-10-CM | POA: Diagnosis not present

## 2024-01-27 DIAGNOSIS — F411 Generalized anxiety disorder: Secondary | ICD-10-CM | POA: Diagnosis not present

## 2024-01-31 ENCOUNTER — Encounter: Payer: Self-pay | Admitting: Family Medicine

## 2024-01-31 ENCOUNTER — Other Ambulatory Visit: Payer: Self-pay | Admitting: Family Medicine

## 2024-01-31 DIAGNOSIS — F411 Generalized anxiety disorder: Secondary | ICD-10-CM

## 2024-01-31 DIAGNOSIS — F902 Attention-deficit hyperactivity disorder, combined type: Secondary | ICD-10-CM

## 2024-02-01 MED ORDER — DEXMETHYLPHENIDATE HCL ER 40 MG PO CP24
40.0000 mg | ORAL_CAPSULE | Freq: Every day | ORAL | 0 refills | Status: DC
  Filled 2024-02-01: qty 30, 30d supply, fill #0

## 2024-02-01 MED ORDER — METHYLPHENIDATE HCL 20 MG PO TABS
30.0000 mg | ORAL_TABLET | Freq: Every day | ORAL | 0 refills | Status: DC
  Filled 2024-02-01 – 2024-02-20 (×2): qty 45, 30d supply, fill #0

## 2024-02-02 ENCOUNTER — Other Ambulatory Visit (HOSPITAL_BASED_OUTPATIENT_CLINIC_OR_DEPARTMENT_OTHER): Payer: Self-pay

## 2024-02-02 ENCOUNTER — Other Ambulatory Visit: Payer: Self-pay

## 2024-02-03 ENCOUNTER — Other Ambulatory Visit (HOSPITAL_BASED_OUTPATIENT_CLINIC_OR_DEPARTMENT_OTHER): Payer: Self-pay

## 2024-02-07 DIAGNOSIS — F411 Generalized anxiety disorder: Secondary | ICD-10-CM | POA: Diagnosis not present

## 2024-02-09 ENCOUNTER — Other Ambulatory Visit (HOSPITAL_BASED_OUTPATIENT_CLINIC_OR_DEPARTMENT_OTHER): Payer: Self-pay

## 2024-02-09 MED ORDER — SERTRALINE HCL 100 MG PO TABS
150.0000 mg | ORAL_TABLET | Freq: Every day | ORAL | 3 refills | Status: DC
  Filled 2024-02-09 – 2024-03-06 (×2): qty 45, 30d supply, fill #0

## 2024-02-12 DIAGNOSIS — F411 Generalized anxiety disorder: Secondary | ICD-10-CM | POA: Diagnosis not present

## 2024-02-18 DIAGNOSIS — F411 Generalized anxiety disorder: Secondary | ICD-10-CM | POA: Diagnosis not present

## 2024-02-21 ENCOUNTER — Other Ambulatory Visit (HOSPITAL_BASED_OUTPATIENT_CLINIC_OR_DEPARTMENT_OTHER): Payer: Self-pay

## 2024-02-25 DIAGNOSIS — F411 Generalized anxiety disorder: Secondary | ICD-10-CM | POA: Diagnosis not present

## 2024-03-02 ENCOUNTER — Other Ambulatory Visit (HOSPITAL_BASED_OUTPATIENT_CLINIC_OR_DEPARTMENT_OTHER): Payer: Self-pay

## 2024-03-02 DIAGNOSIS — L700 Acne vulgaris: Secondary | ICD-10-CM | POA: Diagnosis not present

## 2024-03-02 MED ORDER — SULFAMETHOXAZOLE-TRIMETHOPRIM 800-160 MG PO TABS
1.0000 | ORAL_TABLET | Freq: Two times a day (BID) | ORAL | 2 refills | Status: AC
  Filled 2024-03-02: qty 60, 30d supply, fill #0
  Filled 2024-07-24: qty 60, 30d supply, fill #1

## 2024-03-02 MED ORDER — ADAPALENE-BENZOYL PEROXIDE 0.3-2.5 % EX GEL
CUTANEOUS | 5 refills | Status: AC
Start: 2024-03-02 — End: ?
  Filled 2024-03-02 – 2024-07-01 (×3): qty 45, 30d supply, fill #0

## 2024-03-03 ENCOUNTER — Other Ambulatory Visit (HOSPITAL_BASED_OUTPATIENT_CLINIC_OR_DEPARTMENT_OTHER): Payer: Self-pay

## 2024-03-03 ENCOUNTER — Other Ambulatory Visit: Payer: Self-pay

## 2024-03-04 ENCOUNTER — Other Ambulatory Visit (HOSPITAL_BASED_OUTPATIENT_CLINIC_OR_DEPARTMENT_OTHER): Payer: Self-pay

## 2024-03-06 ENCOUNTER — Other Ambulatory Visit (HOSPITAL_BASED_OUTPATIENT_CLINIC_OR_DEPARTMENT_OTHER): Payer: Self-pay

## 2024-03-06 ENCOUNTER — Other Ambulatory Visit: Payer: Self-pay | Admitting: Family Medicine

## 2024-03-06 DIAGNOSIS — F902 Attention-deficit hyperactivity disorder, combined type: Secondary | ICD-10-CM

## 2024-03-08 ENCOUNTER — Encounter: Payer: Self-pay | Admitting: Family Medicine

## 2024-03-08 ENCOUNTER — Other Ambulatory Visit (HOSPITAL_BASED_OUTPATIENT_CLINIC_OR_DEPARTMENT_OTHER): Payer: Self-pay

## 2024-03-08 MED ORDER — DEXMETHYLPHENIDATE HCL ER 40 MG PO CP24
40.0000 mg | ORAL_CAPSULE | Freq: Every day | ORAL | 0 refills | Status: DC
  Filled 2024-03-08: qty 20, 20d supply, fill #0
  Filled 2024-03-08: qty 10, 10d supply, fill #0
  Filled 2024-03-08: qty 30, 30d supply, fill #0

## 2024-03-09 ENCOUNTER — Other Ambulatory Visit (HOSPITAL_BASED_OUTPATIENT_CLINIC_OR_DEPARTMENT_OTHER): Payer: Self-pay

## 2024-03-09 ENCOUNTER — Encounter: Payer: Self-pay | Admitting: Family Medicine

## 2024-03-09 ENCOUNTER — Ambulatory Visit: Payer: Commercial Managed Care - PPO | Admitting: Family Medicine

## 2024-03-09 DIAGNOSIS — F411 Generalized anxiety disorder: Secondary | ICD-10-CM

## 2024-03-09 DIAGNOSIS — F902 Attention-deficit hyperactivity disorder, combined type: Secondary | ICD-10-CM | POA: Diagnosis not present

## 2024-03-09 MED ORDER — SERTRALINE HCL 100 MG PO TABS
150.0000 mg | ORAL_TABLET | Freq: Every day | ORAL | 1 refills | Status: DC
  Filled 2024-03-09 – 2024-03-29 (×2): qty 135, 90d supply, fill #0
  Filled 2024-07-01: qty 135, 90d supply, fill #1

## 2024-03-09 MED ORDER — DEXMETHYLPHENIDATE HCL ER 40 MG PO CP24
40.0000 mg | ORAL_CAPSULE | Freq: Every day | ORAL | 0 refills | Status: AC
  Filled 2024-07-01: qty 30, 30d supply, fill #0
  Filled ????-??-??: fill #0

## 2024-03-09 MED ORDER — METHYLPHENIDATE HCL 20 MG PO TABS
30.0000 mg | ORAL_TABLET | Freq: Every day | ORAL | 0 refills | Status: DC
  Filled 2024-03-29: qty 45, 30d supply, fill #0

## 2024-03-09 NOTE — Progress Notes (Signed)
   Established Patient Office Visit  Subjective   Patient ID: Tammy Maldonado, female    DOB: [birth year]  Age: 23 y.o. MRN: [id number]  Chief Complaint  Patient presents with   Medical Management of Chronic Issues    Pt is here for follow up and medication refills. She reports that her medications are working well for her, still on the same dosage. 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     Current Outpatient Medications  Medication Instructions   Adapalene -Benzoyl Peroxide  (EPIDUO  FORTE) 0.3-2.5 % GEL Apply 1(one) application(s) topically every day   Clindamycin -Benzoyl Per, Refr, gel 1 Application, Topical, 2 times daily   Dexmethylphenidate  HCl (FOCALIN  XR) 40 mg, Oral, Daily   [START ON 04/05/2024] Dexmethylphenidate  HCl (FOCALIN  XR) 40 mg, Oral, Daily   levocetirizine (XYZAL) 5 MG tablet Xyzal   [START ON 03/22/2024] methylphenidate  (RITALIN ) 30 mg, Oral, Daily after lunch   Multiple Vitamin (MULTIVITAMIN WITH MINERALS) TABS tablet 1 tablet, Daily   sertraline  (ZOLOFT ) 150 mg, Oral, Daily   sulfamethoxazole -trimethoprim  (BACTRIM  DS) 800-160 MG tablet 1 tablet, Oral, 2 times daily   tretinoin  (RETIN-A ) 0.1 % cream 1(one) application(s) topical every day. Apply to Chin, Forehead, Upper Back      Review of Systems  All other systems reviewed and are negative.     Objective:     BP 98/72   Pulse 72   Temp 98.5 F (36.9 C) (Oral)   Ht 5\' 10"  (1.778 m)   Wt 162 lb 1.6 oz (73.5 kg)   LMP 02/02/2024 (Exact Date)   SpO2 100%   BMI 23.26 kg/m    Physical Exam Vitals reviewed.  Constitutional:      Appearance: Normal appearance. She is normal weight.  Eyes:     Conjunctiva/sclera: Conjunctivae normal.  Pulmonary:     Effort: Pulmonary effort is normal.  Neurological:     Mental Status: She is alert and oriented to person, place, and time. Mental status is at baseline.  Psychiatric:        Mood  and Affect: Mood normal.        Behavior: Behavior normal.      No results found for any visits on 03/09/24.    The ASCVD Risk score (Arnett DK, et al., 2019) failed to calculate for the following reasons:   The 2019 ASCVD risk score is only valid for ages 53 to 105    Assessment & Plan:  ADHD (attention deficit hyperactivity disorder), combined type Assessment & Plan: Symptoms are well controlled on the 40 mg XR focalin  in the morning, and 20-30 mg of the short acting in the afternoon. I have refilled her medications. RTC in 3 months for video visit   Orders: -     Methylphenidate  HCl; Take 1.5 tablets (30 mg total) by mouth daily after lunch.  Dispense: 45 tablet; Refill: 0  Generalized anxiety disorder -     Sertraline  HCl; Take 1.5 tablets (150 mg total) by mouth daily.  Dispense: 135 tablet; Refill: 1  Other orders -     Dexmethylphenidate  HCl ER; Take 1 capsule (40 mg total) by mouth daily.  Dispense: 30 capsule; Refill: 0     Return in about 3 months (around 06/09/2024) for video visit for ADHD refills. Aida House, MD

## 2024-03-09 NOTE — Assessment & Plan Note (Signed)
 Symptoms are well controlled on the 40 mg XR focalin  in the morning, and 20-30 mg of the short acting in the afternoon. I have refilled her medications. RTC in 3 months for video visit

## 2024-03-11 DIAGNOSIS — F411 Generalized anxiety disorder: Secondary | ICD-10-CM | POA: Diagnosis not present

## 2024-03-19 DIAGNOSIS — F411 Generalized anxiety disorder: Secondary | ICD-10-CM | POA: Diagnosis not present

## 2024-03-23 DIAGNOSIS — F411 Generalized anxiety disorder: Secondary | ICD-10-CM | POA: Diagnosis not present

## 2024-03-29 ENCOUNTER — Other Ambulatory Visit: Payer: Self-pay | Admitting: Family Medicine

## 2024-03-29 DIAGNOSIS — F902 Attention-deficit hyperactivity disorder, combined type: Secondary | ICD-10-CM

## 2024-03-29 MED ORDER — DEXMETHYLPHENIDATE HCL ER 40 MG PO CP24
40.0000 mg | ORAL_CAPSULE | Freq: Every day | ORAL | 0 refills | Status: DC
  Filled 2024-03-29 – 2024-04-03 (×4): qty 30, 30d supply, fill #0

## 2024-03-30 ENCOUNTER — Other Ambulatory Visit (HOSPITAL_BASED_OUTPATIENT_CLINIC_OR_DEPARTMENT_OTHER): Payer: Self-pay

## 2024-03-30 ENCOUNTER — Other Ambulatory Visit: Payer: Self-pay

## 2024-03-31 ENCOUNTER — Other Ambulatory Visit (HOSPITAL_BASED_OUTPATIENT_CLINIC_OR_DEPARTMENT_OTHER): Payer: Self-pay

## 2024-04-01 ENCOUNTER — Other Ambulatory Visit (HOSPITAL_BASED_OUTPATIENT_CLINIC_OR_DEPARTMENT_OTHER): Payer: Self-pay

## 2024-04-03 ENCOUNTER — Other Ambulatory Visit (HOSPITAL_BASED_OUTPATIENT_CLINIC_OR_DEPARTMENT_OTHER): Payer: Self-pay

## 2024-04-03 DIAGNOSIS — F411 Generalized anxiety disorder: Secondary | ICD-10-CM | POA: Diagnosis not present

## 2024-04-07 DIAGNOSIS — F411 Generalized anxiety disorder: Secondary | ICD-10-CM | POA: Diagnosis not present

## 2024-04-12 DIAGNOSIS — F411 Generalized anxiety disorder: Secondary | ICD-10-CM | POA: Diagnosis not present

## 2024-04-22 DIAGNOSIS — F411 Generalized anxiety disorder: Secondary | ICD-10-CM | POA: Diagnosis not present

## 2024-04-27 ENCOUNTER — Other Ambulatory Visit (HOSPITAL_BASED_OUTPATIENT_CLINIC_OR_DEPARTMENT_OTHER): Payer: Self-pay

## 2024-04-27 ENCOUNTER — Encounter (HOSPITAL_BASED_OUTPATIENT_CLINIC_OR_DEPARTMENT_OTHER): Payer: Self-pay | Admitting: Pharmacist

## 2024-04-27 ENCOUNTER — Other Ambulatory Visit: Payer: Self-pay | Admitting: Family Medicine

## 2024-04-27 DIAGNOSIS — F902 Attention-deficit hyperactivity disorder, combined type: Secondary | ICD-10-CM

## 2024-04-27 MED ORDER — DEXMETHYLPHENIDATE HCL ER 40 MG PO CP24
40.0000 mg | ORAL_CAPSULE | Freq: Every day | ORAL | 0 refills | Status: DC
  Filled 2024-04-27 – 2024-05-05 (×2): qty 30, 30d supply, fill #0

## 2024-04-29 DIAGNOSIS — F411 Generalized anxiety disorder: Secondary | ICD-10-CM | POA: Diagnosis not present

## 2024-05-05 ENCOUNTER — Other Ambulatory Visit (HOSPITAL_BASED_OUTPATIENT_CLINIC_OR_DEPARTMENT_OTHER): Payer: Self-pay

## 2024-05-06 DIAGNOSIS — F411 Generalized anxiety disorder: Secondary | ICD-10-CM | POA: Diagnosis not present

## 2024-05-15 DIAGNOSIS — F411 Generalized anxiety disorder: Secondary | ICD-10-CM | POA: Diagnosis not present

## 2024-05-18 DIAGNOSIS — F411 Generalized anxiety disorder: Secondary | ICD-10-CM | POA: Diagnosis not present

## 2024-05-19 DIAGNOSIS — F411 Generalized anxiety disorder: Secondary | ICD-10-CM | POA: Diagnosis not present

## 2024-05-24 DIAGNOSIS — F411 Generalized anxiety disorder: Secondary | ICD-10-CM | POA: Diagnosis not present

## 2024-05-27 DIAGNOSIS — F411 Generalized anxiety disorder: Secondary | ICD-10-CM | POA: Diagnosis not present

## 2024-05-31 ENCOUNTER — Other Ambulatory Visit (HOSPITAL_BASED_OUTPATIENT_CLINIC_OR_DEPARTMENT_OTHER): Payer: Self-pay

## 2024-05-31 ENCOUNTER — Other Ambulatory Visit: Payer: Self-pay | Admitting: Family Medicine

## 2024-05-31 DIAGNOSIS — F902 Attention-deficit hyperactivity disorder, combined type: Secondary | ICD-10-CM

## 2024-05-31 MED ORDER — DEXMETHYLPHENIDATE HCL ER 40 MG PO CP24
40.0000 mg | ORAL_CAPSULE | Freq: Every day | ORAL | 0 refills | Status: DC
  Filled 2024-05-31 – 2024-06-02 (×3): qty 30, 30d supply, fill #0

## 2024-06-02 ENCOUNTER — Other Ambulatory Visit (HOSPITAL_BASED_OUTPATIENT_CLINIC_OR_DEPARTMENT_OTHER): Payer: Self-pay

## 2024-06-02 ENCOUNTER — Other Ambulatory Visit (HOSPITAL_COMMUNITY): Payer: Self-pay

## 2024-06-07 DIAGNOSIS — F411 Generalized anxiety disorder: Secondary | ICD-10-CM | POA: Diagnosis not present

## 2024-06-10 ENCOUNTER — Telehealth: Admitting: Family Medicine

## 2024-06-10 ENCOUNTER — Other Ambulatory Visit (HOSPITAL_BASED_OUTPATIENT_CLINIC_OR_DEPARTMENT_OTHER): Payer: Self-pay

## 2024-06-10 DIAGNOSIS — F902 Attention-deficit hyperactivity disorder, combined type: Secondary | ICD-10-CM | POA: Diagnosis not present

## 2024-06-10 MED ORDER — DEXMETHYLPHENIDATE HCL ER 40 MG PO CP24
40.0000 mg | ORAL_CAPSULE | Freq: Every day | ORAL | 0 refills | Status: AC
  Filled 2024-06-10 – 2024-08-04 (×3): qty 30, 30d supply, fill #0

## 2024-06-10 MED ORDER — DEXMETHYLPHENIDATE HCL ER 40 MG PO CP24
40.0000 mg | ORAL_CAPSULE | Freq: Every day | ORAL | 0 refills | Status: AC
  Filled 2024-10-01: qty 30, 30d supply, fill #0

## 2024-06-10 MED ORDER — METHYLPHENIDATE HCL 20 MG PO TABS
30.0000 mg | ORAL_TABLET | Freq: Every day | ORAL | 0 refills | Status: DC
  Filled 2024-06-10: qty 45, 30d supply, fill #0

## 2024-06-10 MED ORDER — DEXMETHYLPHENIDATE HCL ER 40 MG PO CP24
40.0000 mg | ORAL_CAPSULE | Freq: Every day | ORAL | 0 refills | Status: AC
  Filled 2024-09-01: qty 30, 30d supply, fill #0

## 2024-06-10 NOTE — Assessment & Plan Note (Signed)
 Symptoms are well controlled on the 40 mg XR focalin  in the morning, and 20-30 mg of the short acting in the afternoon. I have refilled her medications. RTC in 3 months for video visit

## 2024-06-10 NOTE — Progress Notes (Signed)
   Virtual Medical Office Visit  Patient:  Tammy Maldonado      Age: 23 y.o.       Sex:  female  Date:   06/10/2024  PCP:    Ozell Heron HERO, MD   Today's Healthcare Provider: Heron HERO Ozell, MD    Assessment/Plan:   Summary assessment:  ADHD (attention deficit hyperactivity disorder), combined type Assessment & Plan: Symptoms are well controlled on the 40 mg XR focalin  in the morning, and 20-30 mg of the short acting in the afternoon. I have refilled her medications. RTC in 3 months for video visit   Orders: -     Methylphenidate  HCl; Take 1.5 tablets (30 mg total) by mouth daily after lunch.  Dispense: 45 tablet; Refill: 0 -     Dexmethylphenidate  HCl ER; Take 1 capsule (40 mg total) by mouth daily.  Dispense: 30 capsule; Refill: 0 -     Dexmethylphenidate  HCl ER; Take 1 capsule (40 mg total) by mouth daily.  Dispense: 30 capsule; Refill: 0 -     Dexmethylphenidate  HCl ER; Take 1 capsule (40 mg total) by mouth daily.  Dispense: 30 capsule; Refill: 0   20 minutes spent with patient today collecting history and refilling medications, PDMP also reviewed.  No follow-ups on file.   She was advised to call the office or go to ER if her condition worsens    Subjective:   Tammy Maldonado is a 23 y.o. female with PMH significant for: Past Medical History:  Diagnosis Date   ADHD    ADHD (attention deficit hyperactivity disorder)    Allergy    Anxiety    Asthma    Constipation    Eosinophilic esophagitis    GERD (gastroesophageal reflux disease)    IBS (irritable bowel syndrome)    Otitis media    infant   Vision abnormalities    glasses, near sighted     Presenting today with: No chief complaint on file.    She clarifies and reports that her condition: ADHD- pt is currently on focalin  40 mg capsules daily, plus a short acting 20 mg in the afternoon. Patient reports that she is doing well on this regimen, no side effects reported today.   She denies  having any: Jitteriness, heart palpitations, dry mouth.          Objective/Observations  Physical Exam:  Polite and friendly Gen: NAD, resting comfortably Pulm: Normal work of breathing Neuro: Grossly normal, moves all extremities Psych: Normal affect and thought content Problem specific physical exam findings:    No images are attached to the encounter or orders placed in the encounter.    Results: No results found for any visits on 06/10/24.   No results found for this or any previous visit (from the past 2160 hours).         Virtual Visit via Video   I connected with Tammy Maldonado on 06/10/24 at  4:00 PM EDT by a video enabled telemedicine application and verified that I am speaking with the correct person using two identifiers. The limitations of evaluation and management by telemedicine and the availability of in person appointments were discussed. The patient expressed understanding and agreed to proceed.   Percentage of appointment time on video:  100% Patient location: Home Provider location: [facility] Persons participating in the virtual visit: Myself and Patient

## 2024-06-16 DIAGNOSIS — F411 Generalized anxiety disorder: Secondary | ICD-10-CM | POA: Diagnosis not present

## 2024-06-25 DIAGNOSIS — F411 Generalized anxiety disorder: Secondary | ICD-10-CM | POA: Diagnosis not present

## 2024-07-01 ENCOUNTER — Other Ambulatory Visit (HOSPITAL_BASED_OUTPATIENT_CLINIC_OR_DEPARTMENT_OTHER): Payer: Self-pay

## 2024-07-01 ENCOUNTER — Encounter (HOSPITAL_BASED_OUTPATIENT_CLINIC_OR_DEPARTMENT_OTHER): Payer: Self-pay

## 2024-07-08 DIAGNOSIS — F411 Generalized anxiety disorder: Secondary | ICD-10-CM | POA: Diagnosis not present

## 2024-07-17 DIAGNOSIS — F411 Generalized anxiety disorder: Secondary | ICD-10-CM | POA: Diagnosis not present

## 2024-07-22 DIAGNOSIS — F411 Generalized anxiety disorder: Secondary | ICD-10-CM | POA: Diagnosis not present

## 2024-07-24 ENCOUNTER — Other Ambulatory Visit: Payer: Self-pay | Admitting: Family Medicine

## 2024-07-24 ENCOUNTER — Other Ambulatory Visit (HOSPITAL_BASED_OUTPATIENT_CLINIC_OR_DEPARTMENT_OTHER): Payer: Self-pay

## 2024-07-24 DIAGNOSIS — F902 Attention-deficit hyperactivity disorder, combined type: Secondary | ICD-10-CM

## 2024-07-26 ENCOUNTER — Other Ambulatory Visit: Payer: Self-pay

## 2024-07-26 ENCOUNTER — Other Ambulatory Visit (HOSPITAL_BASED_OUTPATIENT_CLINIC_OR_DEPARTMENT_OTHER): Payer: Self-pay

## 2024-07-26 MED ORDER — METHYLPHENIDATE HCL 20 MG PO TABS
30.0000 mg | ORAL_TABLET | Freq: Every day | ORAL | 0 refills | Status: AC
Start: 2024-07-26 — End: ?
  Filled 2024-07-26: qty 45, 30d supply, fill #0

## 2024-07-29 DIAGNOSIS — F411 Generalized anxiety disorder: Secondary | ICD-10-CM | POA: Diagnosis not present

## 2024-08-05 ENCOUNTER — Other Ambulatory Visit (HOSPITAL_BASED_OUTPATIENT_CLINIC_OR_DEPARTMENT_OTHER): Payer: Self-pay

## 2024-08-06 DIAGNOSIS — F411 Generalized anxiety disorder: Secondary | ICD-10-CM | POA: Diagnosis not present

## 2024-08-09 ENCOUNTER — Other Ambulatory Visit (HOSPITAL_BASED_OUTPATIENT_CLINIC_OR_DEPARTMENT_OTHER): Payer: Self-pay

## 2024-08-11 DIAGNOSIS — F411 Generalized anxiety disorder: Secondary | ICD-10-CM | POA: Diagnosis not present

## 2024-08-20 DIAGNOSIS — F411 Generalized anxiety disorder: Secondary | ICD-10-CM | POA: Diagnosis not present

## 2024-08-21 DIAGNOSIS — F411 Generalized anxiety disorder: Secondary | ICD-10-CM | POA: Diagnosis not present

## 2024-08-28 DIAGNOSIS — F411 Generalized anxiety disorder: Secondary | ICD-10-CM | POA: Diagnosis not present

## 2024-09-01 DIAGNOSIS — F411 Generalized anxiety disorder: Secondary | ICD-10-CM | POA: Diagnosis not present

## 2024-09-02 ENCOUNTER — Other Ambulatory Visit: Payer: Self-pay

## 2024-09-02 ENCOUNTER — Other Ambulatory Visit (HOSPITAL_BASED_OUTPATIENT_CLINIC_OR_DEPARTMENT_OTHER): Payer: Self-pay

## 2024-09-09 DIAGNOSIS — F411 Generalized anxiety disorder: Secondary | ICD-10-CM | POA: Diagnosis not present

## 2024-09-15 DIAGNOSIS — F411 Generalized anxiety disorder: Secondary | ICD-10-CM | POA: Diagnosis not present

## 2024-09-17 DIAGNOSIS — F411 Generalized anxiety disorder: Secondary | ICD-10-CM | POA: Diagnosis not present

## 2024-09-22 DIAGNOSIS — F411 Generalized anxiety disorder: Secondary | ICD-10-CM | POA: Diagnosis not present

## 2024-09-24 DIAGNOSIS — F411 Generalized anxiety disorder: Secondary | ICD-10-CM | POA: Diagnosis not present

## 2024-09-29 ENCOUNTER — Other Ambulatory Visit: Payer: Self-pay | Admitting: Family Medicine

## 2024-09-29 ENCOUNTER — Other Ambulatory Visit (HOSPITAL_BASED_OUTPATIENT_CLINIC_OR_DEPARTMENT_OTHER): Payer: Self-pay

## 2024-09-29 DIAGNOSIS — F411 Generalized anxiety disorder: Secondary | ICD-10-CM

## 2024-09-29 MED ORDER — SERTRALINE HCL 100 MG PO TABS
150.0000 mg | ORAL_TABLET | Freq: Every day | ORAL | 1 refills | Status: AC
  Filled 2024-09-29: qty 135, 90d supply, fill #0

## 2024-09-30 DIAGNOSIS — F411 Generalized anxiety disorder: Secondary | ICD-10-CM | POA: Diagnosis not present

## 2024-10-01 ENCOUNTER — Other Ambulatory Visit (HOSPITAL_BASED_OUTPATIENT_CLINIC_OR_DEPARTMENT_OTHER): Payer: Self-pay

## 2024-10-02 DIAGNOSIS — F411 Generalized anxiety disorder: Secondary | ICD-10-CM | POA: Diagnosis not present

## 2024-10-03 DIAGNOSIS — F411 Generalized anxiety disorder: Secondary | ICD-10-CM | POA: Diagnosis not present

## 2024-10-06 DIAGNOSIS — F411 Generalized anxiety disorder: Secondary | ICD-10-CM | POA: Diagnosis not present

## 2024-10-07 ENCOUNTER — Encounter: Payer: Self-pay | Admitting: Family Medicine

## 2024-10-07 DIAGNOSIS — F411 Generalized anxiety disorder: Secondary | ICD-10-CM | POA: Diagnosis not present

## 2024-10-08 DIAGNOSIS — F411 Generalized anxiety disorder: Secondary | ICD-10-CM | POA: Diagnosis not present

## 2024-10-10 DIAGNOSIS — F411 Generalized anxiety disorder: Secondary | ICD-10-CM | POA: Diagnosis not present

## 2024-10-11 DIAGNOSIS — F411 Generalized anxiety disorder: Secondary | ICD-10-CM | POA: Diagnosis not present

## 2024-10-15 DIAGNOSIS — F411 Generalized anxiety disorder: Secondary | ICD-10-CM | POA: Diagnosis not present
